# Patient Record
Sex: Female | Born: 1960
Health system: Southern US, Community
[De-identification: ages and names within clinical notes are randomized; demographics above are authoritative.]

## PROBLEM LIST (undated history)

## (undated) DIAGNOSIS — Z87898 Personal history of other specified conditions: Secondary | ICD-10-CM

## (undated) DIAGNOSIS — T7840XA Allergy, unspecified, initial encounter: Secondary | ICD-10-CM

## (undated) DIAGNOSIS — K602 Anal fissure, unspecified: Secondary | ICD-10-CM

## (undated) DIAGNOSIS — Z9289 Personal history of other medical treatment: Secondary | ICD-10-CM

## (undated) DIAGNOSIS — K5900 Constipation, unspecified: Secondary | ICD-10-CM

## (undated) DIAGNOSIS — M1990 Unspecified osteoarthritis, unspecified site: Secondary | ICD-10-CM

## (undated) DIAGNOSIS — M8580 Other specified disorders of bone density and structure, unspecified site: Secondary | ICD-10-CM

## (undated) DIAGNOSIS — I7300 Raynaud's syndrome without gangrene: Secondary | ICD-10-CM

## (undated) DIAGNOSIS — R55 Syncope and collapse: Secondary | ICD-10-CM

## (undated) DIAGNOSIS — A0472 Enterocolitis due to Clostridium difficile, not specified as recurrent: Secondary | ICD-10-CM

## (undated) DIAGNOSIS — I10 Essential (primary) hypertension: Secondary | ICD-10-CM

## (undated) DIAGNOSIS — I340 Nonrheumatic mitral (valve) insufficiency: Secondary | ICD-10-CM

## (undated) DIAGNOSIS — I951 Orthostatic hypotension: Secondary | ICD-10-CM

## (undated) DIAGNOSIS — Z780 Asymptomatic menopausal state: Secondary | ICD-10-CM

## (undated) DIAGNOSIS — Z803 Family history of malignant neoplasm of breast: Secondary | ICD-10-CM

## (undated) DIAGNOSIS — I341 Nonrheumatic mitral (valve) prolapse: Secondary | ICD-10-CM

## (undated) DIAGNOSIS — R51 Headache: Secondary | ICD-10-CM

## (undated) DIAGNOSIS — M7500 Adhesive capsulitis of unspecified shoulder: Secondary | ICD-10-CM

## (undated) DIAGNOSIS — E785 Hyperlipidemia, unspecified: Secondary | ICD-10-CM

## (undated) DIAGNOSIS — K219 Gastro-esophageal reflux disease without esophagitis: Secondary | ICD-10-CM

## (undated) DIAGNOSIS — D6862 Lupus anticoagulant syndrome: Secondary | ICD-10-CM

## (undated) DIAGNOSIS — K5790 Diverticulosis of intestine, part unspecified, without perforation or abscess without bleeding: Secondary | ICD-10-CM

## (undated) HISTORY — DX: Essential (primary) hypertension: I10

## (undated) HISTORY — DX: Other specified disorders of bone density and structure, unspecified site: M85.80

## (undated) HISTORY — DX: Hyperlipidemia, unspecified: E78.5

## (undated) HISTORY — DX: Constipation, unspecified: K59.00

## (undated) HISTORY — DX: Lupus anticoagulant syndrome: D68.62

## (undated) HISTORY — DX: Asymptomatic menopausal state: Z78.0

## (undated) HISTORY — DX: Enterocolitis due to Clostridium difficile, not specified as recurrent: A04.72

## (undated) HISTORY — DX: Unspecified osteoarthritis, unspecified site: M19.90

## (undated) HISTORY — DX: Diverticulosis of intestine, part unspecified, without perforation or abscess without bleeding: K57.90

## (undated) HISTORY — DX: Anal fissure, unspecified: K60.2

## (undated) HISTORY — DX: Personal history of other specified conditions: Z87.898

## (undated) HISTORY — DX: Raynaud's syndrome without gangrene: I73.00

## (undated) HISTORY — DX: Adhesive capsulitis of unspecified shoulder: M75.00

## (undated) HISTORY — PX: COLONOSCOPY: SHX174

## (undated) HISTORY — DX: Gastro-esophageal reflux disease without esophagitis: K21.9

## (undated) HISTORY — DX: Personal history of other medical treatment: Z92.89

## (undated) HISTORY — DX: Headache: R51

## (undated) HISTORY — DX: Nonrheumatic mitral (valve) prolapse: I34.1

## (undated) HISTORY — DX: Family history of malignant neoplasm of breast: Z80.3

## (undated) HISTORY — DX: Allergy, unspecified, initial encounter: T78.40XA

## (undated) HISTORY — DX: Nonrheumatic mitral (valve) insufficiency: I34.0

---

## 1979-08-30 HISTORY — PX: WISDOM TOOTH EXTRACTION: SHX21

## 1998-02-16 ENCOUNTER — Ambulatory Visit (HOSPITAL_COMMUNITY): Admission: RE | Admit: 1998-02-16 | Discharge: 1998-02-16 | Payer: Self-pay | Admitting: *Deleted

## 1998-02-17 ENCOUNTER — Inpatient Hospital Stay (HOSPITAL_COMMUNITY): Admission: AD | Admit: 1998-02-17 | Discharge: 1998-02-19 | Payer: Self-pay | Admitting: Obstetrics and Gynecology

## 1998-03-12 ENCOUNTER — Encounter (HOSPITAL_COMMUNITY): Admission: RE | Admit: 1998-03-12 | Discharge: 1998-06-10 | Payer: Self-pay | Admitting: *Deleted

## 1998-03-19 ENCOUNTER — Other Ambulatory Visit: Admission: RE | Admit: 1998-03-19 | Discharge: 1998-03-19 | Payer: Self-pay | Admitting: *Deleted

## 1998-06-18 ENCOUNTER — Encounter (HOSPITAL_COMMUNITY): Admission: RE | Admit: 1998-06-18 | Discharge: 1998-09-16 | Payer: Self-pay | Admitting: *Deleted

## 1999-07-29 ENCOUNTER — Other Ambulatory Visit: Admission: RE | Admit: 1999-07-29 | Discharge: 1999-07-29 | Payer: Self-pay | Admitting: *Deleted

## 2000-07-17 ENCOUNTER — Other Ambulatory Visit: Admission: RE | Admit: 2000-07-17 | Discharge: 2000-07-17 | Payer: Self-pay | Admitting: *Deleted

## 2002-01-04 ENCOUNTER — Other Ambulatory Visit: Admission: RE | Admit: 2002-01-04 | Discharge: 2002-01-04 | Payer: Self-pay | Admitting: *Deleted

## 2002-04-27 ENCOUNTER — Encounter: Payer: Self-pay | Admitting: Family Medicine

## 2002-04-27 ENCOUNTER — Ambulatory Visit (HOSPITAL_COMMUNITY): Admission: RE | Admit: 2002-04-27 | Discharge: 2002-04-27 | Payer: Self-pay | Admitting: Family Medicine

## 2003-08-27 ENCOUNTER — Emergency Department (HOSPITAL_COMMUNITY): Admission: EM | Admit: 2003-08-27 | Discharge: 2003-08-27 | Payer: Self-pay | Admitting: Emergency Medicine

## 2004-01-30 ENCOUNTER — Other Ambulatory Visit: Admission: RE | Admit: 2004-01-30 | Discharge: 2004-01-30 | Payer: Self-pay | Admitting: Obstetrics and Gynecology

## 2004-07-12 ENCOUNTER — Ambulatory Visit: Payer: Self-pay | Admitting: Internal Medicine

## 2004-07-28 ENCOUNTER — Ambulatory Visit: Payer: Self-pay | Admitting: Gastroenterology

## 2004-08-09 ENCOUNTER — Ambulatory Visit: Payer: Self-pay | Admitting: Internal Medicine

## 2004-08-11 ENCOUNTER — Ambulatory Visit: Payer: Self-pay | Admitting: Gastroenterology

## 2004-08-29 HISTORY — PX: KNEE SURGERY: SHX244

## 2004-10-18 ENCOUNTER — Ambulatory Visit: Payer: Self-pay | Admitting: Internal Medicine

## 2005-07-05 ENCOUNTER — Ambulatory Visit (HOSPITAL_BASED_OUTPATIENT_CLINIC_OR_DEPARTMENT_OTHER): Admission: RE | Admit: 2005-07-05 | Discharge: 2005-07-05 | Payer: Self-pay | Admitting: Orthopaedic Surgery

## 2005-07-05 ENCOUNTER — Ambulatory Visit (HOSPITAL_COMMUNITY): Admission: RE | Admit: 2005-07-05 | Discharge: 2005-07-05 | Payer: Self-pay | Admitting: Orthopaedic Surgery

## 2005-08-29 HISTORY — PX: FOOT SURGERY: SHX648

## 2005-10-11 ENCOUNTER — Other Ambulatory Visit: Admission: RE | Admit: 2005-10-11 | Discharge: 2005-10-11 | Payer: Self-pay | Admitting: Obstetrics and Gynecology

## 2005-10-14 ENCOUNTER — Ambulatory Visit: Payer: Self-pay | Admitting: Internal Medicine

## 2005-12-06 ENCOUNTER — Ambulatory Visit (HOSPITAL_BASED_OUTPATIENT_CLINIC_OR_DEPARTMENT_OTHER): Admission: RE | Admit: 2005-12-06 | Discharge: 2005-12-06 | Payer: Self-pay | Admitting: Orthopaedic Surgery

## 2005-12-06 ENCOUNTER — Encounter (INDEPENDENT_AMBULATORY_CARE_PROVIDER_SITE_OTHER): Payer: Self-pay | Admitting: Specialist

## 2006-01-02 ENCOUNTER — Ambulatory Visit: Payer: Self-pay | Admitting: Gastroenterology

## 2006-04-28 ENCOUNTER — Ambulatory Visit: Payer: Self-pay | Admitting: Internal Medicine

## 2006-06-21 ENCOUNTER — Ambulatory Visit: Payer: Self-pay | Admitting: Internal Medicine

## 2007-01-24 ENCOUNTER — Ambulatory Visit: Payer: Self-pay | Admitting: Internal Medicine

## 2007-01-29 ENCOUNTER — Ambulatory Visit: Payer: Self-pay | Admitting: Internal Medicine

## 2007-01-29 LAB — CONVERTED CEMR LAB
ALT: 16 units/L (ref 0–40)
AST: 23 units/L (ref 0–37)
Albumin: 3.8 g/dL (ref 3.5–5.2)
Alkaline Phosphatase: 31 units/L — ABNORMAL LOW (ref 39–117)
Anti Nuclear Antibody(ANA): NEGATIVE
BUN: 5 mg/dL — ABNORMAL LOW (ref 6–23)
Basophils Absolute: 0 10*3/uL (ref 0.0–0.1)
Basophils Relative: 0.7 % (ref 0.0–1.0)
Bilirubin Urine: NEGATIVE
Bilirubin, Direct: 0.1 mg/dL (ref 0.0–0.3)
CO2: 29 meq/L (ref 19–32)
Calcium: 9.2 mg/dL (ref 8.4–10.5)
Chloride: 107 meq/L (ref 96–112)
Creatinine, Ser: 0.9 mg/dL (ref 0.4–1.2)
Crystals: NEGATIVE
Eosinophils Absolute: 0.2 10*3/uL (ref 0.0–0.6)
Eosinophils Relative: 4.7 % (ref 0.0–5.0)
GFR calc Af Amer: 87 mL/min
GFR calc non Af Amer: 72 mL/min
Glucose, Bld: 106 mg/dL — ABNORMAL HIGH (ref 70–99)
HCT: 37.1 % (ref 36.0–46.0)
Hemoglobin: 12.7 g/dL (ref 12.0–15.0)
Ketones, ur: NEGATIVE mg/dL
Leukocytes, UA: NEGATIVE
Lymphocytes Relative: 37 % (ref 12.0–46.0)
MCHC: 34.2 g/dL (ref 30.0–36.0)
MCV: 98.8 fL (ref 78.0–100.0)
Monocytes Absolute: 0.4 10*3/uL (ref 0.2–0.7)
Monocytes Relative: 9.9 % (ref 3.0–11.0)
Mucus, UA: NEGATIVE
Neutro Abs: 2 10*3/uL (ref 1.4–7.7)
Neutrophils Relative %: 47.7 % (ref 43.0–77.0)
Nitrite: NEGATIVE
Platelets: 213 10*3/uL (ref 150–400)
Potassium: 4.5 meq/L (ref 3.5–5.1)
RBC / HPF: NONE SEEN
RBC: 3.75 M/uL — ABNORMAL LOW (ref 3.87–5.11)
RDW: 11.7 % (ref 11.5–14.6)
Rheumatoid fact SerPl-aCnc: <20 intl units/mL — ABNORMAL LOW (ref 0.0–20.0)
Sed Rate: 5 mm/hr (ref 0–25)
Sodium: 141 meq/L (ref 135–145)
Specific Gravity, Urine: 1.01 (ref 1.000–1.03)
TSH: 2.65 microintl units/mL (ref 0.35–5.50)
Total Bilirubin: 0.6 mg/dL (ref 0.3–1.2)
Total Protein, Urine: NEGATIVE mg/dL
Total Protein: 6.4 g/dL (ref 6.0–8.3)
Urine Glucose: NEGATIVE mg/dL
Urobilinogen, UA: 0.2 (ref 0.0–1.0)
Vit D, 1,25-Dihydroxy: 29 (ref 20–57)
Vitamin B-12: 228 pg/mL (ref 211–911)
WBC: 4.2 10*3/uL — ABNORMAL LOW (ref 4.5–10.5)
pH: 7.5 (ref 5.0–8.0)

## 2007-05-18 ENCOUNTER — Encounter: Payer: Self-pay | Admitting: *Deleted

## 2007-05-18 DIAGNOSIS — M949 Disorder of cartilage, unspecified: Secondary | ICD-10-CM

## 2007-05-18 DIAGNOSIS — K219 Gastro-esophageal reflux disease without esophagitis: Secondary | ICD-10-CM | POA: Insufficient documentation

## 2007-05-18 DIAGNOSIS — I10 Essential (primary) hypertension: Secondary | ICD-10-CM | POA: Insufficient documentation

## 2007-05-18 DIAGNOSIS — M899 Disorder of bone, unspecified: Secondary | ICD-10-CM | POA: Insufficient documentation

## 2007-11-01 ENCOUNTER — Ambulatory Visit: Payer: Self-pay | Admitting: Internal Medicine

## 2007-11-02 ENCOUNTER — Encounter: Payer: Self-pay | Admitting: Internal Medicine

## 2007-11-07 ENCOUNTER — Ambulatory Visit: Payer: Self-pay | Admitting: Internal Medicine

## 2007-11-07 DIAGNOSIS — J329 Chronic sinusitis, unspecified: Secondary | ICD-10-CM | POA: Insufficient documentation

## 2007-11-07 DIAGNOSIS — R42 Dizziness and giddiness: Secondary | ICD-10-CM | POA: Insufficient documentation

## 2007-11-07 DIAGNOSIS — R079 Chest pain, unspecified: Secondary | ICD-10-CM | POA: Insufficient documentation

## 2007-11-08 LAB — CONVERTED CEMR LAB
ALT: 14 units/L (ref 0–35)
AST: 18 units/L (ref 0–37)
Albumin: 3.8 g/dL (ref 3.5–5.2)
Alkaline Phosphatase: 38 units/L — ABNORMAL LOW (ref 39–117)
BUN: 7 mg/dL (ref 6–23)
Basophils Absolute: 0 10*3/uL (ref 0.0–0.1)
Basophils Relative: 0.6 % (ref 0.0–1.0)
Bilirubin Urine: NEGATIVE
Bilirubin, Direct: 0.1 mg/dL (ref 0.0–0.3)
CO2: 30 meq/L (ref 19–32)
Calcium: 9.3 mg/dL (ref 8.4–10.5)
Chloride: 105 meq/L (ref 96–112)
Cholesterol: 196 mg/dL (ref 0–200)
Creatinine, Ser: 0.8 mg/dL (ref 0.4–1.2)
Crystals: NEGATIVE
Eosinophils Absolute: 0.3 10*3/uL (ref 0.0–0.6)
Eosinophils Relative: 5.3 % — ABNORMAL HIGH (ref 0.0–5.0)
GFR calc Af Amer: 99 mL/min
GFR calc non Af Amer: 82 mL/min
Glucose, Bld: 107 mg/dL — ABNORMAL HIGH (ref 70–99)
HCT: 39.7 % (ref 36.0–46.0)
HDL: 88 mg/dL (ref >39.0)
Hemoglobin: 13 g/dL (ref 12.0–15.0)
Ketones, ur: NEGATIVE mg/dL
LDL Cholesterol: 97 mg/dL (ref 0–99)
Lymphocytes Relative: 34.1 % (ref 12.0–46.0)
MCHC: 32.7 g/dL (ref 30.0–36.0)
MCV: 100.5 fL — ABNORMAL HIGH (ref 78.0–100.0)
Monocytes Absolute: 0.6 10*3/uL (ref 0.2–0.7)
Monocytes Relative: 13 % — ABNORMAL HIGH (ref 3.0–11.0)
Neutro Abs: 2.3 10*3/uL (ref 1.4–7.7)
Neutrophils Relative %: 47 % (ref 43.0–77.0)
Nitrite: NEGATIVE
Platelets: 240 10*3/uL (ref 150–400)
Potassium: 4.3 meq/L (ref 3.5–5.1)
RBC: 3.95 M/uL (ref 3.87–5.11)
RDW: 11.8 % (ref 11.5–14.6)
Sodium: 141 meq/L (ref 135–145)
Specific Gravity, Urine: 1.01 (ref 1.000–1.03)
TSH: 1.79 microintl units/mL (ref 0.35–5.50)
Total Bilirubin: 0.5 mg/dL (ref 0.3–1.2)
Total CHOL/HDL Ratio: 2.2
Total Protein, Urine: NEGATIVE mg/dL
Total Protein: 6.5 g/dL (ref 6.0–8.3)
Triglycerides: 56 mg/dL (ref 0–149)
Urine Glucose: NEGATIVE mg/dL
Urobilinogen, UA: 0.2 (ref 0.0–1.0)
VLDL: 11 mg/dL (ref 0–40)
WBC: 4.9 10*3/uL (ref 4.5–10.5)
pH: 7 (ref 5.0–8.0)

## 2007-12-10 LAB — CONVERTED CEMR LAB: Pap Smear: ABNORMAL

## 2008-01-04 ENCOUNTER — Ambulatory Visit: Payer: Self-pay | Admitting: Internal Medicine

## 2008-08-25 ENCOUNTER — Ambulatory Visit: Payer: Self-pay | Admitting: Gastroenterology

## 2008-09-08 ENCOUNTER — Ambulatory Visit: Payer: Self-pay | Admitting: Gastroenterology

## 2008-09-08 LAB — HM COLONOSCOPY

## 2008-11-18 ENCOUNTER — Ambulatory Visit: Payer: Self-pay | Admitting: Internal Medicine

## 2008-11-18 ENCOUNTER — Telehealth: Payer: Self-pay | Admitting: Internal Medicine

## 2008-11-18 LAB — CONVERTED CEMR LAB: Sed Rate: 5 mm/hr (ref 0–22)

## 2008-11-19 ENCOUNTER — Encounter: Payer: Self-pay | Admitting: Internal Medicine

## 2008-11-19 LAB — CONVERTED CEMR LAB
Anti Nuclear Antibody(ANA): NEGATIVE
Anticardiolipin IgA: 11 (ref <13)
Anticardiolipin IgG: <7 (ref <11)
Anticardiolipin IgM: <7 (ref <10)
Vit D, 1,25-Dihydroxy: 48 (ref 30–89)

## 2008-11-21 ENCOUNTER — Telehealth: Payer: Self-pay | Admitting: Internal Medicine

## 2008-11-24 ENCOUNTER — Ambulatory Visit: Payer: Self-pay | Admitting: Hematology & Oncology

## 2008-11-26 ENCOUNTER — Encounter: Payer: Self-pay | Admitting: Internal Medicine

## 2008-11-26 LAB — CBC WITH DIFFERENTIAL (CANCER CENTER ONLY)
BASO#: 0 10*3/uL (ref 0.0–0.2)
BASO%: 0.9 % (ref 0.0–2.0)
EOS%: 3.8 % (ref 0.0–7.0)
Eosinophils Absolute: 0.2 10*3/uL (ref 0.0–0.5)
HCT: 42 % (ref 34.8–46.6)
HGB: 13.9 g/dL (ref 11.6–15.9)
LYMPH#: 1.3 10*3/uL (ref 0.9–3.3)
LYMPH%: 26 % (ref 14.0–48.0)
MCH: 32.4 pg (ref 26.0–34.0)
MCHC: 33 g/dL (ref 32.0–36.0)
MCV: 98 fL (ref 81–101)
MONO#: 0.3 10*3/uL (ref 0.1–0.9)
MONO%: 6.9 % (ref 0.0–13.0)
NEUT#: 3 10*3/uL (ref 1.5–6.5)
NEUT%: 62.4 % (ref 39.6–80.0)
Platelets: 202 10*3/uL (ref 145–400)
RBC: 4.28 10*6/uL (ref 3.70–5.32)
RDW: 10 % — ABNORMAL LOW (ref 10.5–14.6)
WBC: 4.8 10*3/uL (ref 3.9–10.0)

## 2008-11-26 LAB — PROTIME-INR (CHCC SATELLITE)
INR: 1 — ABNORMAL LOW (ref 2.0–3.5)
Protime: 12 Seconds (ref 10.6–13.4)

## 2008-11-26 LAB — CHCC SATELLITE - SMEAR

## 2008-12-01 ENCOUNTER — Ambulatory Visit: Payer: Self-pay | Admitting: Hematology

## 2008-12-01 LAB — PROTIME-INR
INR: 1.5 — ABNORMAL LOW (ref 2.00–3.50)
Protime: 18 Seconds — ABNORMAL HIGH (ref 10.6–13.4)

## 2008-12-03 ENCOUNTER — Ambulatory Visit: Payer: Self-pay | Admitting: Vascular Surgery

## 2008-12-08 LAB — PROTIME-INR (CHCC SATELLITE)
INR: 2.3 (ref 2.0–3.5)
Protime: 27.6 Seconds — ABNORMAL HIGH (ref 10.6–13.4)

## 2008-12-15 LAB — PROTIME-INR
INR: 3 (ref 2.00–3.50)
Protime: 36 Seconds — ABNORMAL HIGH (ref 10.6–13.4)

## 2008-12-22 ENCOUNTER — Encounter: Payer: Self-pay | Admitting: Gastroenterology

## 2008-12-22 ENCOUNTER — Encounter: Payer: Self-pay | Admitting: Internal Medicine

## 2008-12-22 LAB — CBC WITH DIFFERENTIAL (CANCER CENTER ONLY)
BASO#: 0 10*3/uL (ref 0.0–0.2)
BASO%: 0.8 % (ref 0.0–2.0)
EOS%: 3.3 % (ref 0.0–7.0)
Eosinophils Absolute: 0.2 10*3/uL (ref 0.0–0.5)
HCT: 38.3 % (ref 34.8–46.6)
HGB: 13.1 g/dL (ref 11.6–15.9)
LYMPH#: 1.9 10*3/uL (ref 0.9–3.3)
LYMPH%: 34.9 % (ref 14.0–48.0)
MCH: 33 pg (ref 26.0–34.0)
MCHC: 34.2 g/dL (ref 32.0–36.0)
MCV: 97 fL (ref 81–101)
MONO#: 0.3 10*3/uL (ref 0.1–0.9)
MONO%: 5.9 % (ref 0.0–13.0)
NEUT#: 3 10*3/uL (ref 1.5–6.5)
NEUT%: 55.1 % (ref 39.6–80.0)
Platelets: 191 10*3/uL (ref 145–400)
RBC: 3.97 10*6/uL (ref 3.70–5.32)
RDW: 10.3 % — ABNORMAL LOW (ref 10.5–14.6)
WBC: 5.5 10*3/uL (ref 3.9–10.0)

## 2008-12-22 LAB — PROTIME-INR (CHCC SATELLITE)
INR: 1.4 — ABNORMAL LOW (ref 2.0–3.5)
Protime: 16.8 Seconds — ABNORMAL HIGH (ref 10.6–13.4)

## 2008-12-25 ENCOUNTER — Ambulatory Visit: Payer: Self-pay | Admitting: Internal Medicine

## 2008-12-25 DIAGNOSIS — M7500 Adhesive capsulitis of unspecified shoulder: Secondary | ICD-10-CM | POA: Insufficient documentation

## 2009-01-12 ENCOUNTER — Ambulatory Visit: Payer: Self-pay | Admitting: Hematology

## 2009-01-12 LAB — PROTIME-INR
INR: 1.4 — ABNORMAL LOW (ref 2.00–3.50)
Protime: 16.8 Seconds — ABNORMAL HIGH (ref 10.6–13.4)

## 2009-01-19 LAB — PROTIME-INR
INR: 2.5 (ref 2.00–3.50)
Protime: 30 Seconds — ABNORMAL HIGH (ref 10.6–13.4)

## 2009-01-30 ENCOUNTER — Ambulatory Visit: Payer: Self-pay | Admitting: Hematology & Oncology

## 2009-02-02 ENCOUNTER — Encounter: Payer: Self-pay | Admitting: Internal Medicine

## 2009-02-02 LAB — PROTIME-INR (CHCC SATELLITE)
INR: 2.4 (ref 2.0–3.5)
Protime: 28.8 Seconds — ABNORMAL HIGH (ref 10.6–13.4)

## 2009-02-02 LAB — CBC WITH DIFFERENTIAL (CANCER CENTER ONLY)
BASO#: 0 10*3/uL (ref 0.0–0.2)
BASO%: 0.4 % (ref 0.0–2.0)
EOS%: 3.5 % (ref 0.0–7.0)
Eosinophils Absolute: 0.1 10*3/uL (ref 0.0–0.5)
HCT: 34.8 % (ref 34.8–46.6)
HGB: 11.6 g/dL (ref 11.6–15.9)
LYMPH#: 1.3 10*3/uL (ref 0.9–3.3)
LYMPH%: 32 % (ref 14.0–48.0)
MCH: 32.2 pg (ref 26.0–34.0)
MCHC: 33.3 g/dL (ref 32.0–36.0)
MCV: 97 fL (ref 81–101)
MONO#: 0.3 10*3/uL (ref 0.1–0.9)
MONO%: 6.4 % (ref 0.0–13.0)
NEUT#: 2.3 10*3/uL (ref 1.5–6.5)
NEUT%: 57.7 % (ref 39.6–80.0)
Platelets: 200 10*3/uL (ref 145–400)
RBC: 3.6 10*6/uL — ABNORMAL LOW (ref 3.70–5.32)
RDW: 11.2 % (ref 10.5–14.6)
WBC: 4 10*3/uL (ref 3.9–10.0)

## 2009-02-03 ENCOUNTER — Encounter: Payer: Self-pay | Admitting: Internal Medicine

## 2009-02-03 LAB — LUPUS ANTICOAGULANT PANEL
DRVVT 1:1 Mix: 38.7 secs (ref 36.1–47.0)
DRVVT: 55 secs — ABNORMAL HIGH (ref 36.1–47.0)
PTT Lupus Anticoagulant: 55.6 secs — ABNORMAL HIGH (ref 36.3–48.8)
PTTLA 4:1 Mix: 46.4 secs (ref 36.3–48.8)

## 2009-02-17 ENCOUNTER — Ambulatory Visit (HOSPITAL_COMMUNITY): Admission: RE | Admit: 2009-02-17 | Discharge: 2009-02-17 | Payer: Self-pay | Admitting: Cardiovascular Disease

## 2009-02-23 LAB — PROTIME-INR
INR: 1 — ABNORMAL LOW (ref 2.00–3.50)
Protime: 12 Seconds (ref 10.6–13.4)

## 2009-03-11 ENCOUNTER — Encounter: Payer: Self-pay | Admitting: Internal Medicine

## 2009-03-19 ENCOUNTER — Ambulatory Visit: Payer: Self-pay | Admitting: Hematology

## 2009-04-16 ENCOUNTER — Ambulatory Visit: Payer: Self-pay | Admitting: Hematology

## 2010-02-22 ENCOUNTER — Telehealth: Payer: Self-pay | Admitting: Internal Medicine

## 2010-05-11 LAB — HM MAMMOGRAPHY: HM Mammogram: NORMAL

## 2010-05-11 LAB — CONVERTED CEMR LAB: Pap Smear: NORMAL

## 2010-07-01 ENCOUNTER — Ambulatory Visit: Payer: Self-pay | Admitting: Family

## 2010-07-01 DIAGNOSIS — J029 Acute pharyngitis, unspecified: Secondary | ICD-10-CM | POA: Insufficient documentation

## 2010-09-19 ENCOUNTER — Encounter: Payer: Self-pay | Admitting: Internal Medicine

## 2010-09-30 NOTE — Letter (Signed)
[facility]   Imported By: Lanelle Bal 03/30/2009 15:09:30  _____________________________________________________________________  External Attachment:    Type:   Image     Comment:   External Document

## 2010-09-30 NOTE — Progress Notes (Signed)
Summary: Nexium Refill  Phone Note Refill Request Message from:  Fax from Pharmacy on February 22, 2010 9:09 AM  Refills Requested: Medication #1:  NEXIUM 40 MG  CPDR one by mouth  two times a day   Dosage confirmed as above?Dosage Confirmed   Brand Name Necessary? No   Supply Requested: 1 month   Last Refilled: 11/17/2009  Method Requested: Electronic Next Appointment Scheduled: None Initial call taken by: Glendell Docker CMA,  February 22, 2010 9:10 AM  Follow-up for Phone Call        refill x 3.  Please inform pt , it has been over 1 yr since last OV.  I can not provide refills unless pt seen within 1 yr.  I suggest  CPX visit when convenient for pt Follow-up by: D. Thomos Lemons DO,  February 22, 2010 5:36 PM  Additional Follow-up for Phone Call Additional follow up Details #1::        patient advised per Dr Artist Pais instructions, she has schdeduled for 8/9 @ 11 am,  fastingblood work entered for [facility] for 8/2  for CBCD,BMET, LIPID AND TSH.  refill submitted via escript Additional Follow-up by: Glendell Docker CMA,  February 22, 2010 5:44 PM

## 2010-09-30 NOTE — Letter (Signed)
Summary: Office Note / [facility].  Office Note / [facility].   Imported By: Lennie Odor 04/30/2009 16:10:35  _____________________________________________________________________  External Attachment:    Type:   Image     Comment:   External Document

## 2010-09-30 NOTE — Miscellaneous (Signed)
Summary: Orders Update  Clinical Lists Changes  Orders: Added new Test order of TLB-Udip w/ Micro (81001-URINE) - Signed 

## 2010-09-30 NOTE — Progress Notes (Signed)
  Phone Note Outgoing Call   Summary of Call: discussed blood test results with patient.   sed rate and ana is negative.  She recently started nifedipine.  Before she started medication - right hand turned blood.  She has not had any further episodes since starting medication.  I advised pt follow up with rheumatologist at [facility] for further evaluation Initial call taken by: D. Thomos Lemons DO,  November 21, 2008 1:06 PM  Follow-up for Phone Call        Called pt re:  abnormal result on antiphospholipid panel.   I advised pt start anticoagulation.  I discussed clinical case with Dr. Myna Hidalgo.   Start pt on Arixtra 7.5 mg daily until coumadin can be started.    We can cancel referral to [facility].   Arrange follow up with Dr. Myna Hidalgo  - Sheral Flow or Tues  Stop Aspirin.  Stop nifedipine Follow-up by: D. Thomos Lemons DO,  November 21, 2008 5:40 PM  Additional Follow-up for Phone Call Additional follow up Details #1::        Appt  Dr Myna Hidalgo  Wednesday March 31 @  7:30am   Pt notified   Additional Follow-up by: Darral Dash,  November 24, 2008 2:33 PM  New Problems: PRIMARY HYPERCOAGULABLE STATE (ICD-289.81)   New Problems: PRIMARY HYPERCOAGULABLE STATE (ICD-289.81) New/Updated Medications: ARIXTRA 7.5 MG/0.6ML SOLN (FONDAPARINUX SODIUM) 7.5 mg [place] once daily

## 2010-09-30 NOTE — Assessment & Plan Note (Signed)
Summary: FU-PER PT RS-$50-STC   Vital Signs:  Patient Profile:   50 Years Old Female Height:     66 inches Weight:      134.38 pounds BMI:     21.77 Temp:     99.3 degrees F oral Pulse rate:   86 / minute BP sitting:   128 / 79  (right arm)  Vitals Entered By: Glendell Docker (Jan 04, 2008 10:31 AM)                 Chief Complaint:  6 WEEK F/U.  History of Present Illness: 50 y/o white female for follow up.  Pt reports sinus infection resolved with ceftin.  Her sleep issues are much better with lunesta.  She used for 1-2 wks.  No adverse effects noted.  Her daughter is still struggling with Crohn's disease.    Current Allergies (reviewed today): No known allergies   Past Medical History:    Reviewed history from 05/18/2007 and no changes required:       GERD       Hypertension       Osteopenia   Family History:    Father has anemia.    Mother has history of colon cancer.   Risk Factors:  PAP Smear History:     Date of Last PAP Smear:  12/10/2007    Results:  abnormal-Repeat in 6 months     Physical Exam  General:     alert, well-developed, and well-nourished.   Lungs:     normal respiratory effort and no accessory muscle use.   Heart:     normal rate, regular rhythm, and no gallop.   Psych:     normally interactive, good eye contact, not anxious appearing, and not depressed appearing.      Impression & Recommendations:  Problem # 1:  INSOMNIA (ICD-780.52) Assessment: Improved Transient insomnia due to life stressors.  Resolved.  Pt can use lunesta on as needed basis. Her updated medication list for this problem includes:    Lunesta 2 Mg Tabs (Eszopiclone) ..... One by mouth at bedtime prn   Problem # 2:  MITRAL VALVE PROLAPSE (ICD-424.0) Assessment: Improved Pt previously complained of dizzy spells .  Pt reports cardiology evaluation.  Neg w/u.  Symptoms resolved. Her updated medication list for this problem includes:    Adult Aspirin Low  Strength 81 Mg Tbdp (Aspirin) ..... Once daily   Complete Medication List: 1)  Adult Aspirin Low Strength 81 Mg Tbdp (Aspirin) .... Once daily 2)  Nexium 40 Mg Cpdr (Esomeprazole magnesium) .... One by mouth  two times a day 3)  Lunesta 2 Mg Tabs (Eszopiclone) .... One by mouth at bedtime prn    ] Current Allergies (reviewed today): No known allergies    Preventive Care Screening  Pap Smear:    Date:  12/10/2007    Results:  abnormal-Repeat in 6 months

## 2010-09-30 NOTE — Miscellaneous (Signed)
Summary: [facility] Previsit/prep  Clinical Lists Changes  Medications: Added new medication of MOVIPREP 100 GM  SOLR (PEG-KCL-NACL-NASULF-NA ASC-C) As per prep instructions. - Signed Rx of MOVIPREP 100 GM  SOLR (PEG-KCL-NACL-NASULF-NA ASC-C) As per prep instructions.;  #1 x 0;  Signed;  Entered by: Wyona Almas RN;  Authorized by: Meryl Dare MD Clementeen Graham;  Method used: Electronically to [facility]*, [address], [place], [place]  [id number], Ph: [phone], Fax: [phone] Observations: Added new observation of NKA: T (08/25/2008 9:01)    Prescriptions: MOVIPREP 100 GM  SOLR (PEG-KCL-NACL-NASULF-NA ASC-C) As per prep instructions.  #1 x 0   Entered by:   Wyona Almas RN   Authorized by:   Meryl Dare MD [facility]   Signed by:   Wyona Almas RN on 08/25/2008   Method used:   Electronically to        [facility]* (retail)       [address]       [place], [place]  [id number]       Ph: [phone]       Fax: [phone]   RxID:   [id number]   Appended Document: [facility] Previsit/prep Pt. has hx of reflus and has been on Nexium "for years".  Pt. asked how long one had to stay on Nexium.  On questioning her, she said her Maternal Grandfather had esophageal cancer.  I offered her to have an appt. with Dr. Russella Dar to set up an endoscopy for diagnostic purposes. She refused for now.  Said maybe  at a later time.  Pamphlets on Reflux and Barrett's given to pt.  Advised pt. to mention long term use of Nexium to Dr. Russella Dar.  Appended Document: [facility] Previsit/prep On reviewing chart I see pt. had an endoscopy in 2005 that showed reflux. when pt was questioned, she said she'd not had an endoscopy before.

## 2010-09-30 NOTE — Progress Notes (Signed)
  Phone Note Call from Patient   Caller: Patient Summary of Call: Pt. made a appointment for tomorrow since Dr. Artist Pais had no available time today. She states her finger has been blue for 2days and is worried about it.  She does have Raynaud's Syndrome but it has never stayed blue! Just wanted to make sure that her waiting to see Dr. Artist Pais until  Wednesday will be fine and she does not need to be worked in today?? Call pt. back and let her know something. Initial call taken by: Michaelle Copas,  November 18, 2008 2:06 PM  Follow-up for Phone Call        have her come in 4:30 pm Follow-up by: D. Thomos Lemons DO,  November 18, 2008 2:15 PM  Additional Follow-up for Phone Call Additional follow up Details #1::        Called pt. and she will come in today at 4:30 pm Additional Follow-up by: Michaelle Copas,  November 18, 2008 2:18 PM

## 2010-09-30 NOTE — Letter (Signed)
Summary: Stacey Drain, MD Rheumatology  Stacey Drain, MD Rheumatology   Imported By: Lanelle Bal 02/11/2009 11:53:31  _____________________________________________________________________  External Attachment:    Type:   Image     Comment:   External Document

## 2010-09-30 NOTE — Assessment & Plan Note (Signed)
Summary: sore throat/mhf   Vital Signs:  Patient profile:   50 year old female Height:      66 inches Weight:      135.25 pounds BMI:     21.91 O2 Sat:      100 % on Room air Temp:     97.8 degrees F oral Pulse rate:   64 / minute Pulse rhythm:   regular Resp:     16 per minute BP sitting:   116 / 72  (right arm) Cuff size:   regular  Vitals Entered By: Glendell Docker CMA (July 01, 2010 9:09 AM)  O2 Flow:  Room air CC: Sore throat Is Patient Diabetic? No Pain Assessment Patient in pain? no      Comments c/o sudden onset of sore throat this am, states she has not been feeling well for the past few days   Visit Type:  acute Primary Care Provider:  Dondra Spry DO  CC:  Sore throat.  History of Present Illness: Ms Ferran is a a 50 year old female who presents today with 24 hour hx of sore throat. (R side of throat)  Also  reports + mucous in throat for past few days.  Mild ear pain on the right.  No OTC meds used.  Daughter had GI upset yesterday.  Notes that she has participated in multiple group meetings at work where she may have been exposed to sick contacts.  Preventive Screening-Counseling & Management  Alcohol-Tobacco     Smoking Status: quit  Allergies (verified): No Known Drug Allergies  Past History:  Past Medical History: Last updated: 12/25/2008 GERD Hypertension Osteopenia Raynauds Vitamin D deficiency Hx of abnormal PAP - S/P colposcopy Ischemic digit  secondary to antiphospholipid syndrome and lupus anticoagulant 10/2008 Chronic anticoagulation Hx of frozen shoulder - bilateral    Review of Systems       see HPI  Physical Exam  General:  Well-developed,well-nourished,in no acute distress; alert,appropriate and cooperative throughout examination Head:  Normocephalic and atraumatic without obvious abnormalities. No apparent alopecia or balding. Eyes:  PERRLA, sclera are anicteric Ears:  External ear exam shows no significant  lesions or deformities.  Otoscopic examination reveals clear canals, tympanic membranes are intact bilaterally without bulging, retraction, inflammation or discharge. Hearing is grossly normal bilaterally. Mouth:  Mild pharyngeal erythema without exudates Neck:  No deformities, masses, or tenderness noted. Lungs:  Normal respiratory effort, chest expands symmetrically. Lungs are clear to auscultation, no crackles or wheezes. Heart:  Normal rate and regular rhythm. S1 and S2 normal without gallop, murmur, click, rub or other extra sounds. Cervical Nodes:  No lymphadenopathy noted   Impression & Recommendations:  Problem # 1:  PHARYNGITIS, VIRAL, ACUTE (ICD-462) Assessment New Rapid strep is negative.  Recommended conservative measures-: Gargle twice daily with salt water. Take Tylenol 650mg  every 6 hours as needed for pain You may use over the counter Cepacol lozenges or Chloraseptic spray as needed for sore throat.  Complete Medication List: 1)  Nexium 40 Mg Cpdr (Esomeprazole magnesium) .... One by mouth  two times a day 2)  Temazepam 15 Mg Caps (Temazepam) .... One tablet by mouth at bedtime as needed 3)  Vitamin D 2000 Unit Tabs (Cholecalciferol) .... Take 1 tablet by mouth once a day 4)  B-100 Tabs (Vitamins-lipotropics) .... Take 1 tablet by mouth once a day  Other Orders: Admin 1st Vaccine (69629) Flu Vaccine 2yrs + (52841)  Patient Instructions: 1)  Gargle twice daily with salt  water. 2)  Take Tylenol 650mg  every 6 hours as needed for pain 3)  You may use over the counter Cepacol lozenges or Chloraseptic spray as needed for sore throat. 4)  Call if your symptoms worsen, or if they are not improved in the next 48-72 hours.   Orders Added: 1)  Est. Patient Level III [16109] 2)  Admin 1st Vaccine [90471] 3)  Flu Vaccine 62yrs + [60454]    Current Allergies (reviewed today): No known allergies    Preventive Care Screening  Mammogram:    Date:  05/11/2010     Results:  normal   Pap Smear:    Date:  05/11/2010    Results:  normal   Flu Vaccine Consent Questions     Do you have a history of severe allergic reactions to this vaccine? no    Any prior history of allergic reactions to egg and/or gelatin? no    Do you have a sensitivity to the preservative Thimersol? no    Do you have a past history of Guillan-Barre Syndrome? no    Do you currently have an acute febrile illness? no    Have you ever had a severe reaction to latex? no    Vaccine information given and explained to patient? yes    Are you currently pregnant? no    Lot Number:[id number]   Exp Date:02/26/2011   Site Given  Left Deltoid IM.  Nicki Guadalajara Fergerson CMA Duncan Dull)  July 01, 2010 10:13 AM

## 2010-09-30 NOTE — Assessment & Plan Note (Signed)
Summary: CPX/ NO PAP /$50 /NWS  Medications Added NEXIUM 40 MG  CPDR (ESOMEPRAZOLE MAGNESIUM) one by mouth  two times a day LUNESTA 2 MG  TABS (ESZOPICLONE) one by mouth at bedtime prn CEFTIN 500 MG  TABS (CEFUROXIME AXETIL) one by mouth two times a day        Vital Signs:  Patient Profile:   50 Years Old Female Height:     66 inches Weight:      132.50 pounds BMI:     21.46 Temp:     97.9 degrees F oral Pulse rate:   92 / minute BP sitting:   124 / 92  (right arm)  Vitals Entered By: Glendell Docker (November 07, 2007 9:00 AM)                 Chief Complaint:  Multiple medical problems or concerns.  History of Present Illness: 50 year old with multiple complaints.  She reports URI symptoms >1 wk.  She has had dizzy spells.  She feels like she is going to pass out.  Her symptoms last 15 secs.  She denies prodromal symptoms.  She has history of palpitations.    She also suffered rib injury while reaching across driver seat to close minivan door.    Current Allergies: No known allergies   Past Medical History:    Reviewed history from 05/18/2007 and no changes required:       GERD       Hypertension       Osteopenia   Social History:    Married    3 children    Occupation:  Technical sales engineer     Former Smoker     Alcohol use-yes - (1-2 glasses of wine per night)   Risk Factors:  Tobacco use:  quit Alcohol use:  yes  Mammogram History:     Date of Last Mammogram:  09/17/2007    Results:  normal     Physical Exam  General:     alert, well-developed, and well-nourished.   Head:     normocephalic and atraumatic.   Neck:     supple.   Chest Wall:     chest wall tenderness right chest (7th and 8th rib) Lungs:     normal respiratory effort and normal breath sounds.   Heart:     normal rate, regular rhythm, no murmur, no gallop, and no rub.   Abdomen:     soft, non-tender, no hepatomegaly, and no splenomegaly.   Extremities:     No lower extremity  edema  Neurologic:     cranial nerves II-XII intact.   Psych:     good eye contact and slightly anxious.      Impression & Recommendations:  Problem # 1:  DIZZINESS AND GIDDINESS (ICD-780.4) Patient reports intermittent dizzy spells.  She feels like she is going to pass out.  She has hx of mild MVP and question ASD.  Her EKG is normal.  She has f/u appt with her cardiologist 11/20/07.  Her recent electrolytes are normal.  I suspect symptoms may be due to sleep deprivation and stress. Her updated medication list for this problem includes:    Claritin 10 Mg Tabs (Loratadine) .Marland Kitchen... As needed   Problem # 2:  RIB PAIN, RIGHT SIDED (ICD-786.50) Pt strained her ribs while reaching to close her minivan door.  She heard popping noise.  Her CXR is negative for rib fracture.  I suspect rib/intercostal muscle strain.  Samples of  diclofenac gel provided.  Orders: T-2 View CXR, Same Day (71020.5TC) T-Ribs Unilateral 2 Views (71100TC)   Problem # 3:  SINUSITIS (ICD-473.9) Pt with > 1 wk of upper respiratory congestion and cough.  She complains of fascial pressure.  Her updated medication list for this problem includes:    Ceftin 500 Mg Tabs (Cefuroxime axetil) ..... One by mouth two times a day   Problem # 4:  INSOMNIA (ICD-780.52) Pt reports increased stress at home.  Her daughter was recently diagnosed with Crohn's disease.  She feels like she has not slept in several  weeks.  Her updated medication list for this problem includes:    Lunesta 2 Mg Tabs (Eszopiclone) ..... One by mouth at bedtime prn   Problem # 5:  GERD (ICD-530.81) Pt reports uncontrolled GERD.  Increase Nexium to two times a day. Her updated medication list for this problem includes:    Nexium 40 Mg Cpdr (Esomeprazole magnesium) ..... One by mouth  two times a day   Complete Medication List: 1)  Adult Aspirin Low Strength 81 Mg Tbdp (Aspirin) .... Once daily 2)  Nexium 40 Mg Cpdr (Esomeprazole magnesium) .... One by  mouth  two times a day 3)  Tylenol 325 Mg Tabs (Acetaminophen) .... As needed 4)  Claritin 10 Mg Tabs (Loratadine) .... As needed 5)  Lunesta 2 Mg Tabs (Eszopiclone) .... One by mouth at bedtime prn 6)  Ceftin 500 Mg Tabs (Cefuroxime axetil) .... One by mouth two times a day  Other Orders: EKG w/ Interpretation (93000)   Patient Instructions: 1)  Please schedule a follow-up appointment in 6 weeks.    Prescriptions: CEFTIN 500 MG  TABS (CEFUROXIME AXETIL) one by mouth two times a day  #20 x 0   Entered and Authorized by:   D. Thomos Lemons DO   Signed by:   D. Thomos Lemons DO on 11/07/2007   Method used:   Electronically sent to ...       [facility]*       [address]       [place], [place]  [address]       Ph: [phone] or [phone]       Fax: [phone]   RxID:   [id number] LUNESTA 2 MG  TABS (ESZOPICLONE) one by mouth at bedtime prn  #30 x 3   Entered and Authorized by:   D. Thomos Lemons DO   Signed by:   D. Thomos Lemons DO on 11/07/2007   Method used:   Print then Give to Patient   RxID:   [id number] NEXIUM 40 MG  CPDR (ESOMEPRAZOLE MAGNESIUM) one by mouth  two times a day  #60 x 5   Entered and Authorized by:   D. Thomos Lemons DO   Signed by:   D. Thomos Lemons DO on 11/07/2007   Method used:   Electronically sent to ...       [facility]*       [address]       [place], [place]  [address]       Ph: [phone] or [phone]       Fax: [phone]   RxID:   [id number]  ]  Preventive Care Screening  Mammogram:    Date:  09/17/2007    Results:  normal

## 2010-09-30 NOTE — Letter (Signed)
[facility]   Imported By: Lanelle Bal 04/27/2009 09:02:49  _____________________________________________________________________  External Attachment:    Type:   Image     Comment:   External Document

## 2010-10-01 NOTE — Letter (Signed)
Summary: Stacey Drain, MD Rheumatology  Stacey Drain, MD Rheumatology   Imported By: Lanelle Bal 04/14/2009 11:45:48  _____________________________________________________________________  External Attachment:    Type:   Image     Comment:   External Document

## 2010-12-14 ENCOUNTER — Other Ambulatory Visit: Payer: Self-pay | Admitting: Internal Medicine

## 2010-12-14 ENCOUNTER — Other Ambulatory Visit: Payer: Self-pay

## 2010-12-14 DIAGNOSIS — Z0000 Encounter for general adult medical examination without abnormal findings: Secondary | ICD-10-CM

## 2010-12-15 ENCOUNTER — Other Ambulatory Visit (INDEPENDENT_AMBULATORY_CARE_PROVIDER_SITE_OTHER): Payer: BC Managed Care – PPO | Admitting: Internal Medicine

## 2010-12-15 ENCOUNTER — Other Ambulatory Visit (INDEPENDENT_AMBULATORY_CARE_PROVIDER_SITE_OTHER): Payer: BC Managed Care – PPO

## 2010-12-15 DIAGNOSIS — Z0000 Encounter for general adult medical examination without abnormal findings: Secondary | ICD-10-CM

## 2010-12-15 DIAGNOSIS — Z13220 Encounter for screening for lipoid disorders: Secondary | ICD-10-CM

## 2010-12-15 LAB — CBC WITH DIFFERENTIAL/PLATELET
Basophils Absolute: 0 10*3/uL (ref 0.0–0.1)
Basophils Relative: 0.6 % (ref 0.0–3.0)
Eosinophils Absolute: 0.2 10*3/uL (ref 0.0–0.7)
Eosinophils Relative: 4.5 % (ref 0.0–5.0)
HCT: 39.6 % (ref 36.0–46.0)
Hemoglobin: 13.4 g/dL (ref 12.0–15.0)
Lymphocytes Relative: 43.5 % (ref 12.0–46.0)
Lymphs Abs: 1.9 10*3/uL (ref 0.7–4.0)
MCHC: 33.8 g/dL (ref 30.0–36.0)
MCV: 99.8 fl (ref 78.0–100.0)
Monocytes Absolute: 0.4 10*3/uL (ref 0.1–1.0)
Monocytes Relative: 8.6 % (ref 3.0–12.0)
Neutro Abs: 1.9 10*3/uL (ref 1.4–7.7)
Neutrophils Relative %: 42.8 % — ABNORMAL LOW (ref 43.0–77.0)
Platelets: 218 10*3/uL (ref 150.0–400.0)
RBC: 3.96 Mil/uL (ref 3.87–5.11)
RDW: 12.2 % (ref 11.5–14.6)
WBC: 4.4 10*3/uL — ABNORMAL LOW (ref 4.5–10.5)

## 2010-12-15 LAB — BASIC METABOLIC PANEL
BUN: 11 mg/dL (ref 6–23)
CO2: 30 mEq/L (ref 19–32)
Calcium: 9.8 mg/dL (ref 8.4–10.5)
Chloride: 101 mEq/L (ref 96–112)
Creatinine, Ser: 0.8 mg/dL (ref 0.4–1.2)
GFR: 79.3 mL/min (ref >60.00)
Glucose, Bld: 87 mg/dL (ref 70–99)
Potassium: 4.3 mEq/L (ref 3.5–5.1)
Sodium: 139 mEq/L (ref 135–145)

## 2010-12-15 LAB — TSH: TSH: 1.89 u[IU]/mL (ref 0.35–5.50)

## 2010-12-15 LAB — URINALYSIS, ROUTINE W REFLEX MICROSCOPIC
Bilirubin Urine: NEGATIVE
Hgb urine dipstick: NEGATIVE
Ketones, ur: NEGATIVE
Nitrite: NEGATIVE
Specific Gravity, Urine: 1.01 (ref 1.000–1.030)
Total Protein, Urine: NEGATIVE
Urine Glucose: NEGATIVE
Urobilinogen, UA: 0.2 (ref 0.0–1.0)
pH: 7.5 (ref 5.0–8.0)

## 2010-12-15 LAB — LIPID PANEL
Cholesterol: 263 mg/dL — ABNORMAL HIGH (ref 0–200)
HDL: 99.2 mg/dL (ref >39.00)
Total CHOL/HDL Ratio: 3
Triglycerides: 47 mg/dL (ref 0.0–149.0)
VLDL: 9.4 mg/dL (ref 0.0–40.0)

## 2010-12-15 LAB — HEPATIC FUNCTION PANEL
ALT: 23 U/L (ref 0–35)
AST: 26 U/L (ref 0–37)
Albumin: 4.3 g/dL (ref 3.5–5.2)
Alkaline Phosphatase: 36 U/L — ABNORMAL LOW (ref 39–117)
Bilirubin, Direct: 0.1 mg/dL (ref 0.0–0.3)
Total Bilirubin: 1.1 mg/dL (ref 0.3–1.2)
Total Protein: 7.1 g/dL (ref 6.0–8.3)

## 2010-12-15 LAB — LDL CHOLESTEROL, DIRECT: Direct LDL: 144.4 mg/dL

## 2010-12-18 ENCOUNTER — Encounter: Payer: Self-pay | Admitting: Internal Medicine

## 2010-12-21 ENCOUNTER — Ambulatory Visit (INDEPENDENT_AMBULATORY_CARE_PROVIDER_SITE_OTHER): Payer: BC Managed Care – PPO | Admitting: Internal Medicine

## 2010-12-21 ENCOUNTER — Encounter: Payer: Self-pay | Admitting: Internal Medicine

## 2010-12-21 VITALS — BP 120/80 | HR 77 | Temp 98.4°F | Resp 16 | Wt 133.0 lb

## 2010-12-21 DIAGNOSIS — E559 Vitamin D deficiency, unspecified: Secondary | ICD-10-CM

## 2010-12-21 DIAGNOSIS — K219 Gastro-esophageal reflux disease without esophagitis: Secondary | ICD-10-CM

## 2010-12-21 DIAGNOSIS — Z0000 Encounter for general adult medical examination without abnormal findings: Secondary | ICD-10-CM

## 2010-12-21 NOTE — Progress Notes (Signed)
Subjective:    Patient ID: Katrina [name], female    DOB: 07/16/1961, 50 y.o.   MRN: [id number]  HPI  50 y/o female with hx of raynaud's, and gerd for routine cpx. No significant interval med hx.   Pt seen by rheumatologist.  Dr. Phylliss Bob repeated antiphospholipid panel which was reported normal.  Pt still occ gets bluish finger.  GERD - taking PPI regularly.  No dysphagia   Review of Systems   Constitutional: Negative for activity change, appetite change and unexpected weight change.  Eyes: Negative for visual disturbance.  Respiratory: Negative for cough, chest tightness and shortness of breath.   Cardiovascular: Negative for chest pain.  Genitourinary: Negative for difficulty urinating.  Neurological: Negative for headaches.  Gastrointestinal: Negative for abdominal pain, heartburn melena or hematochezia Psych: Negative for depression or anxiety     Past Medical History  Diagnosis Date  . GERD (gastroesophageal reflux disease)   . Hypertension   . Osteopenia   . Raynaud disease     question false positive antiphospholipid panel  . Vitamin D deficiency   . History of abnormal Pap smear   . Frozen shoulder     history of bilateral frozen shoulder    History   Social History  . Marital Status: Married    Spouse Name: N/A    Number of Children: N/A  . Years of Education: N/A   Occupational History  . Technical sales engineer Other   Social History Main Topics  . Smoking status: Former Games developer  . Smokeless tobacco: Not on file  . Alcohol Use: Yes  . Drug Use: Not on file  . Sexually Active: Not on file   Other Topics Concern  . Not on file   Social History Narrative   Married   3 children    Occupation:  Technical sales engineer     Former Smoker    Alcohol use-yes - (1-2 glasses of wine per night)    No past surgical history on file.  Family History  Problem Relation Age of Onset  . Anemia Father   . Colon cancer Mother     history of     No Known Allergies  Current  Outpatient Prescriptions on File Prior to Visit  Medication Sig Dispense Refill  . B Complex Vitamins (B COMPLEX 100 PO) Take by mouth daily.        . Cholecalciferol (VITAMIN D) 2000 UNITS CAPS Take 2,000 Units by mouth daily.        Marland Kitchen esomeprazole (NEXIUM) 40 MG capsule Take 40 mg by mouth 2 (two) times daily.        . temazepam (RESTORIL) 15 MG capsule Take 15 mg by mouth at bedtime as needed.          BP 120/80  Pulse 77  Temp(Src) 98.4 F (36.9 C) (Oral)  Resp 16  Wt 133 lb (60.328 kg)  SpO2 100%      Objective:   Physical Exam   Constitutional: Appears well-developed and well-nourished. No distress.  HENT:  Head: Normocephalic and atraumatic.  Right Ear: External ear normal.  Left Ear: External ear normal.  Mouth/Throat: Oropharynx is clear and moist.  Eyes: Conjunctivae are normal. Pupils are equal, round, and reactive to light.  Neck: Normal range of motion. Neck supple. No thyromegaly present.       No carotid bruit  Cardiovascular: Normal rate, regular rhythm and normal heart sounds.  Exam reveals no gallop and no friction rub.   No murmur heard. Pulmonary/Chest:  Effort normal and breath sounds normal.  No wheezes. No rales.  Abdominal: Soft. Bowel sounds are normal. No mass. There is no tenderness.  Neurological: Alert. No cranial nerve deficit.  Skin: Skin is warm and dry.  Psychiatric: Normal mood and affect. Behavior is normal.         Assessment & Plan:

## 2010-12-21 NOTE — Patient Instructions (Addendum)
Take ranitidine 150 mg twice daily for 2-4 as you stop taking Nexium. Use over the counter melatonin 2 mg as directed

## 2010-12-22 ENCOUNTER — Other Ambulatory Visit: Payer: Self-pay | Admitting: Internal Medicine

## 2010-12-22 LAB — VITAMIN D 25 HYDROXY (VIT D DEFICIENCY, FRACTURES): Vit D, 25-Hydroxy: 53 ng/mL (ref 30–89)

## 2011-01-10 ENCOUNTER — Encounter: Payer: Self-pay | Admitting: Internal Medicine

## 2011-01-10 NOTE — Assessment & Plan Note (Signed)
I suggest transition off of  PPI to H2 blocker. Then use OTC zantac as needed Reviewed antireflux measures. If GERD symptoms gets worse, pt can restart nexium

## 2011-01-10 NOTE — Assessment & Plan Note (Signed)
Reviewed adult health maintenance protocols. PAP and Pelvic performed by GYN Colonoscopy and mammogram up to date

## 2011-01-14 NOTE — Op Note (Signed)
Katrina [name], Katrina [name]     ACCOUNT NO.:  [id number]   MEDICAL RECORD NO.:  [id number]          PATIENT TYPE:  AMB   LOCATION:  DSC                          FACILITY:  [facility]   PHYSICIAN:  Lubertha Basque. Dalldorf, M.D.DATE OF BIRTH:  August 06, 1961   DATE OF PROCEDURE:  12/06/2005  DATE OF DISCHARGE:                                 OPERATIVE REPORT   PREOPERATIVE DIAGNOSIS:  Left foot Morton's neuroma.   POSTOPERATIVE DIAGNOSIS:  Left foot Morton's neuroma.   PROCEDURE:  Excision left foot Morton's neuroma.   ANESTHESIA:  General and block.   SURGEON:  Lubertha Basque. Jerl Santos, M.D.   INDICATIONS FOR PROCEDURE:  The patient is a 50 year old woman with a long  history of a painful left foot.  She has persisted with difficulty despite  injections in the third interspace on two occasions and relegation to fairly  wide shoes.  She has pain which limits her ability to wear dress shoes and  to walk without discomfort.  She is offered excision of a presumed Morton's  neuroma in the third interspace of her left foot.  Informed operative  consent was obtained discussion about the possible complications of reaction  to anesthesia, infection, neurovascular injury.  The patient understood that  she would likely be numb forever in this interspace.   DESCRIPTION OF PROCEDURE:  The patient was taken to the operating suite  where general anesthetic was induced without difficulty.  She was also given  a block in the preanesthesia area.  She was positioned supine and prepped  and draped in the normal sterile fashion.  After administration of IV  Kefzol, the left leg was elevated, exsanguinated, and a tourniquet inflated  about the calf.  A dorsal incision was made in the left foot third  interspace.  Dissection was carried down to the transverse metatarsal  ligament.  This was incised and an obvious neuroma came into view.  The  neuroma was excised proximally just before the level of the metatarsal head  and taken out distally past the bifurcation at each toe.  The neuroma was  then sent to pathology in formalin.  The tourniquet was deflated and a small  amount of bleeding was easily controlled with Bovie cautery.  Her toes  became pink and warm immediately.  The wound was irrigated followed by  reapproximation of the skin with nylon.  Some Adaptic was applied followed  by dry gauze and a loose Ace wrap.  Estimated blood loss and fluids as well  as accurate tourniquet time can be obtained from anesthesia records.   DISPOSITION:  The patient was extubated in the operating room and taken to  the recovery in stable addition.  The plans were for her to go home the same  day and follow up in the office next week.  I will contact her by phone  tonight.     Lubertha Basque Jerl Santos, M.D.  Electronically Signed    PGD/MEDQ  D:  12/06/2005  T:  12/06/2005  Job:  [id number]

## 2011-01-14 NOTE — Op Note (Signed)
Katrina [name], Katrina [name]     ACCOUNT NO.:  [id number]   MEDICAL RECORD NO.:  [id number]          PATIENT TYPE:  AMB   LOCATION:  DSC                          FACILITY:  [facility]   PHYSICIAN:  Lubertha Basque. Dalldorf, M.D.DATE OF BIRTH:  31-Jul-1961   DATE OF PROCEDURE:  07/05/2005  DATE OF DISCHARGE:                                 OPERATIVE REPORT   PREOP DIAGNOSIS:  1.  Left knee torn medial meniscus.  2.  Left knee chondromalacia.  3.  Left foot Morton's neuroma.   POSTOP DIAGNOSIS:  1.  Left knee torn medial meniscus.  2.  Left knee chondromalacia.  3.  Left foot Morton's neuroma.   PROCEDURES:  1.  Left knee partial medial meniscectomy.  2.  Left knee chondroplasty and abrasion.  3.  Left foot interspace injection.   ANESTHESIA:  General.   ATTENDING SURGEON:  Lubertha Basque. Jerl Santos, M.D.   ASSISTANT:  Lindwood Qua, P.A.   INDICATIONS FOR PROCEDURE:  The patient is a 50 year old woman with a long  history of left knee pain. This has persisted despite oral anti-  inflammatories, activity restriction, and an injection. She has had two MRI  scans done, years apart. The first did show meniscus tear which we decided  to observe, but then her pain became worse. The subsequent scan showed a  more impressive complex meniscus tear with an associated Baker's cyst. She  has also felt some degenerative change. She is offered an arthroscopy at  this point. She also has had trouble with the Morton's neuroma in the third  interspace of the left foot. We have injected, that in the past, and she is  offered a repeat injection here, under the same anesthetic. Informed  operative consent was obtained after discussion of possible complications of  reaction to anesthesia and infection.   DESCRIPTION OF PROCEDURE:  The patient was brought to the operating suite  where general anesthetic was applied without difficulty. She was positioned  supine and prepped and draped in a normal sterile  fashion. After the  administration of IV antibiotic, an arthroscopy of the left knee was  performed through two inferior portals. Suprapatellar pouch was benign while  the patellofemoral joint exhibited some mild chondromalacia dressed with a  brief chondroplasty. In the medial compartment she did have a complex tear  of the posterior and middle horn of the meniscus. A 15%, partial,  medial  meniscectomy was required back to stable tissue. This was done with the  shaver and basket. I did not see much in the way of degenerative changes, in  this compartment, other than grade 1 and 2 findings. The ACL appeared to be  intact; and the lateral compartment was completely benign with no evidence  of meniscal or articular cartilage injury. The knee was thoroughly  irrigated, followed by placement of Marcaine with epinephrine and morphine.  Adaptic was placed over the portals followed by dry gauze and a loose Ace  wrap. We then injected the third interspace of the left foot with 1 mL of  Depo-Medrol in 1 mL of lidocaine. A total of 40 mg of Depo were placed. A  Band-Aid was  applied here. Estimated blood loss and intraoperative fluids  can be obtained from anesthesia records.   DISPOSITION:  The patient was extubated in the operating room, and taken to  recovery room in stable addition. Plans were for her to go home the same day  and to followup in the office in less than a week. I will contact her by  phone tonight.      Lubertha Basque Jerl Santos, M.D.  Electronically Signed     PGD/MEDQ  D:  07/05/2005  T:  07/05/2005  Job:  [id number]

## 2011-04-11 ENCOUNTER — Encounter: Payer: Self-pay | Admitting: Internal Medicine

## 2011-04-11 ENCOUNTER — Ambulatory Visit (INDEPENDENT_AMBULATORY_CARE_PROVIDER_SITE_OTHER): Payer: BC Managed Care – PPO | Admitting: Internal Medicine

## 2011-04-11 VITALS — BP 128/82 | HR 70 | Temp 97.8°F | Resp 12 | Ht 66.0 in | Wt 131.0 lb

## 2011-04-11 DIAGNOSIS — N951 Menopausal and female climacteric states: Secondary | ICD-10-CM

## 2011-04-11 DIAGNOSIS — N393 Stress incontinence (female) (male): Secondary | ICD-10-CM

## 2011-04-11 DIAGNOSIS — R87619 Unspecified abnormal cytological findings in specimens from cervix uteri: Secondary | ICD-10-CM

## 2011-04-11 DIAGNOSIS — I341 Nonrheumatic mitral (valve) prolapse: Secondary | ICD-10-CM | POA: Insufficient documentation

## 2011-04-11 DIAGNOSIS — I059 Rheumatic mitral valve disease, unspecified: Secondary | ICD-10-CM

## 2011-04-11 DIAGNOSIS — E785 Hyperlipidemia, unspecified: Secondary | ICD-10-CM

## 2011-04-11 LAB — LIPID PANEL
Cholesterol: 249 mg/dL — ABNORMAL HIGH (ref 0–200)
HDL: 92 mg/dL (ref >39)
LDL Cholesterol: 134 mg/dL — ABNORMAL HIGH (ref 0–99)
Total CHOL/HDL Ratio: 2.7 Ratio
Triglycerides: 117 mg/dL (ref <150)
VLDL: 23 mg/dL (ref 0–40)

## 2011-04-11 NOTE — Patient Instructions (Signed)
Schedule CPE Take Ibuprofen as needed

## 2011-04-11 NOTE — Progress Notes (Signed)
  Subjective:    Patient ID: Katrina [name], female    DOB: 1961/03/18, 50 y.o.   MRN: [id number]  HPI  New pt here for first visit.  Former pt. of Dr. Artist Pais.  See Problem list.  [name] describes that one week ago she was lifting herself from a lounge chair and felt a "pop"  In her R side of lower rib area.  She had significant pain  and pain with breathing for 1-2 days but it has markedly improved now.  She had similar episode 2 years ago in same area and all X-rays were negative so she did not worry too much this time.  Took Ibuprofen but has not needed any last few days.  Breathing fine now.    Significant menopausal flushing day and night but has mother with breast cancer and does not want hormones.    GERD  Off Nexium since April of this year.  Not many symptoms  Had normal pap by Dr. Rana Snare last visit.  Is due for repeat pap in 4 weeks.  LMP  Summer of 2011    Was told by Dr. Artist Pais last visit that she had a high cholesterol.  She is fasting today   Review of Systems See HPI    Objective:   Physical Exam  Physical Exam  Nursing note and vitals reviewed.  Constitutional: She is oriented to person, place, and time. She appears well-developed and well-nourished.  HENT:  Head: Normocephalic and atraumatic.  Cardiovascular: Normal rate and regular rhythm. Exam reveals no gallop and no friction rub.  No murmur heard.  Pulmonary/Chest: Breath sounds normal. She has no wheezes. She has no rales.  Good bilateral breath sounds.  She has mild point tenderness near rib 10-11 on the right.  No bruising of chest wall  Neurological: She is alert and oriented to person, place, and time.  Skin: Skin is warm and dry.  Psychiatric: She has a normal mood and affect. Her behavior is normal.           Assessment & Plan:  1)  R sided chest wall pain:  Likely musculoskeletal injury.  I offered X-ray today but pt declined.  Doubt fracture.  Likley intercostal muscle or tendon strain  Okay for  OTC Ibuprofen 2)  Symptomatic menopause:  Will discuss non hormone options future visit 3) Hyperlipidemia  Will check today 3) GERD  Off long term Nexium now 4)  Abnormal pap:  Due for repeat pap in 4 weeks 5)  See Problem list

## 2011-10-26 ENCOUNTER — Ambulatory Visit (INDEPENDENT_AMBULATORY_CARE_PROVIDER_SITE_OTHER): Payer: BC Managed Care – PPO | Admitting: Cardiovascular Disease

## 2011-10-26 ENCOUNTER — Encounter: Payer: Self-pay | Admitting: Cardiovascular Disease

## 2011-10-26 VITALS — BP 129/88 | HR 72 | Ht 66.0 in | Wt 136.8 lb

## 2011-10-26 DIAGNOSIS — I10 Essential (primary) hypertension: Secondary | ICD-10-CM

## 2011-10-26 NOTE — Progress Notes (Signed)
Katrina [name] Date of Birth  10-Sep-1960 [facility]     [place]  [address]    [address]   [address] [place], [place]  [address]    [place], [place]  [address] [phone]  Fax  [phone]  [phone]  Fax [phone]  Problem List: 1. Hypetension   History of Present Illness:  Katrina [name] is a 51 yo who presents today for evaluation of HTN.  She was noted to have elevation of her diastolic blood pressure.  She's also been having some dyspnea with exertion. She went hiking at the [facility] [name] recently and had significant shortness breath climbing up the trail.  She feels like her metabolism has slowed recently.  She walks her dogs 1 1/2 miles a day without problems.  She drinks a couple of glasses of wine each night.  Current Outpatient Prescriptions on File Prior to Visit  Medication Sig Dispense Refill  . aspirin 81 MG tablet Take 81 mg by mouth daily.        . B Complex Vitamins (B COMPLEX 100 PO) Take by mouth daily.       . Cholecalciferol (VITAMIN D) 2000 UNITS CAPS Take 2,000 Units by mouth daily.        . temazepam (RESTORIL) 15 MG capsule Take 15 mg by mouth at bedtime as needed.          No Known Allergies  Past Medical History  Diagnosis Date  . GERD (gastroesophageal reflux disease)   . Osteopenia   . Raynaud disease     question false positive antiphospholipid panel  . Vitamin d deficiency   . History of abnormal Pap smear     S/P lazer conization  . Menopause   . Mitral valve prolapse     no report of murmur  . Frozen shoulder     history of bilateral frozen shoulder  . Family history of breast cancer in first degree relative     Past Surgical History  Procedure Date  . Knee surgery 2006    left  . Foot surgery 2007    left    History  Smoking status  . Former Smoker -- 1.0 packs/day for 6 years  . Types: Cigarettes  . Quit date: 07/02/1991  Smokeless tobacco  . Never Used    History  Alcohol  Use  . 8.4 oz/week  . 14 Glasses of wine per week    Family History  Problem Relation Age of Onset  . Anemia Father   . Heart disease Father   . Colon cancer Mother     history of   . Breast cancer Mother   . Cancer Mother     carcinoid- colon  . Breast cancer Maternal Aunt   . Cancer Paternal Aunt     brain  . Heart disease Maternal Grandmother   . Cancer Maternal Grandfather     throat    Reviw of Systems:  Reviewed in the HPI.  All other systems are negative.  Physical Exam: Blood pressure 129/88, pulse 72, height 5\' 6"  (1.676 m), weight 136 lb 12.8 oz (62.052 kg). General: Well developed, well nourished, in no acute distress.  Head: Normocephalic, atraumatic, sclera non-icteric, mucus membranes are moist,   Neck: Supple. Carotids are 2 + without bruits. No JVD  Lungs: Clear bilaterally to auscultation.  Heart: regular rate  has a very soft midsystolic click. There is no significant murmur.  Abdomen: Soft, non-tender, non-distended  with normal bowel sounds. No hepatomegaly. No rebound/guarding. No masses.  Msk:  Strength and tone are normal  Extremities: No clubbing or cyanosis. No edema.  Distal pedal pulses are 2+ and equal bilaterally.  Neuro: Alert and oriented X 3. Moves all extremities spontaneously.  Psych:  Responds to questions appropriately with a normal affect.  ECG: Normal sinus rhythm. She was increased voltage is in leads 2 in the chest leads. This high voltage is because she is thin.  Assessment / Plan:

## 2011-10-26 NOTE — Assessment & Plan Note (Signed)
We had an extensive discussion about possible causes of her hypertension. She does not eat a lot of excessive salt but they eat some slightly. She drinks a couple glasses of wine at night which may be contributing to her high blood pressure. She also admits that she's not sleeping well at night and this could certainly be contributing to her hypertension.  She may also have sleep apnea. She states that she snores sometimes and she occasionally wakes up in the middle of the night gasping for air.  She is not obese and has not gained any weight.  We discussed the possibility that she may need a sleep study at some point.  LC her again in the office in 3 months. In the meantime, She'll try to exercise a little bit more vigorously. I've asked her to possibly decrease her  wine at night which may actually help her sleep better and may help with her hypertension. I'll see her again in several months. If her blood pressure still elevated then we'll start her on either low-dose HCTZ and potassium or perhaps losartan.

## 2011-10-26 NOTE — Patient Instructions (Signed)
Your physician recommends that you schedule a follow-up appointment in: 3 MONTHS FOR HTN  Your physician recommends that you continue on your current medications as directed. Please refer to the Current Medication list given to you today.

## 2012-01-27 ENCOUNTER — Ambulatory Visit: Payer: BC Managed Care – PPO | Admitting: Cardiovascular Disease

## 2012-06-01 ENCOUNTER — Telehealth: Payer: Self-pay | Admitting: Gastroenterology

## 2012-06-01 NOTE — Telephone Encounter (Signed)
Patient with a 3 week history of change in bowel habits and mucous in her stool.  She has burning in her lower abdomen with BM and constipation.  She will come see Willette Cluster RNP on Monday at 2:00

## 2012-06-04 ENCOUNTER — Ambulatory Visit (INDEPENDENT_AMBULATORY_CARE_PROVIDER_SITE_OTHER): Payer: BC Managed Care – PPO | Admitting: Nurse Practitioner

## 2012-06-04 ENCOUNTER — Other Ambulatory Visit (INDEPENDENT_AMBULATORY_CARE_PROVIDER_SITE_OTHER): Payer: BC Managed Care – PPO

## 2012-06-04 ENCOUNTER — Encounter: Payer: Self-pay | Admitting: Nurse Practitioner

## 2012-06-04 VITALS — BP 138/78 | HR 68 | Ht 66.0 in | Wt 139.2 lb

## 2012-06-04 DIAGNOSIS — R635 Abnormal weight gain: Secondary | ICD-10-CM

## 2012-06-04 DIAGNOSIS — K602 Anal fissure, unspecified: Secondary | ICD-10-CM

## 2012-06-04 DIAGNOSIS — K5900 Constipation, unspecified: Secondary | ICD-10-CM

## 2012-06-04 LAB — TSH: TSH: 2.3 u[IU]/mL (ref 0.35–5.50)

## 2012-06-04 MED ORDER — DILTIAZEM GEL 2 %: CUTANEOUS | Status: DC

## 2012-06-04 NOTE — Progress Notes (Signed)
06/04/2012 Katrina [name] [id number] 1961/03/29   HISTORY OF PRESENT ILLNESS:  Patient is a 51 year old female with a history of HTN who had a screening colonoscopy in January 2010 by Dr. Russella Dar. She has a history of colon cancer in mother at age 60. Colonoscopy was normal. She was felt at one time to have a blood clotting disorder / lupus anticoagulant but further workup was negative. She does carry a diagnosis of Raynauld's.   Patient is here for evaluation of constipation. She has had a lifelong problem with constipation.  Despite eating ample fruits and vegetables her constipation is getting worse. She is avid exerciser. No new medications, she consumes plenty of water.   She has recently gained 7 pounds. She is current with GYN exams.      Past Medical History  Diagnosis Date  . GERD (gastroesophageal reflux disease)   . Osteopenia   . Raynaud disease     question false positive antiphospholipid panel  . Vitamin D deficiency   . History of abnormal Pap smear     S/P lazer conization  . Menopause   . Mitral valve prolapse     no report of murmur  . Frozen shoulder     history of bilateral frozen shoulder  . Family history of breast cancer in first degree relative    Past Surgical History  Procedure Date  . Knee surgery 2006    left  . Foot surgery     left  . Wisdom tooth extraction 1981    reports that she quit smoking about 20 years ago. Her smoking use included Cigarettes. She has a 6 pack-year smoking history. She has never used smokeless tobacco. She reports that she drinks about 8.4 ounces of alcohol per week. She reports that she does not use illicit drugs. family history includes Anemia in her father; Breast cancer in her maternal aunt and mother; Cancer in her maternal grandfather and paternal aunt; Colon cancer in her mother; Heart disease in her father and maternal grandmother; and Stroke in her sister. No Known Allergies    Outpatient Encounter  Prescriptions as of 06/04/2012  Medication Sig Dispense Refill  . aspirin 81 MG tablet Take 81 mg by mouth daily.        . B Complex Vitamins (B COMPLEX 100 PO) Take by mouth daily.       . Cholecalciferol (VITAMIN D) 2000 UNITS CAPS Take 2,000 Units by mouth daily.        . temazepam (RESTORIL) 15 MG capsule Take 15 mg by mouth at bedtime as needed.           REVIEW OF SYSTEMS  : Positive for arthritis, back pain, vision changes, fatigue, headaches, night sweats, shortness of breath, sleeping problems, swelling of feet, urine leakage. All other systems reviewed and negative except where noted in the History of Present Illness.   PHYSICAL EXAM: BP 138/78  Pulse 68  Ht 5\' 6"  (1.676 m)  Wt 139 lb 3.2 oz (63.141 kg)  BMI 22.47 kg/m2 General: Well developed white female in no acute distress Head: Normocephalic and atraumatic Eyes:  sclerae anicteric,conjunctive pink. Ears: Normal auditory acuity Neck: Supple, no masses.  Lungs: Clear throughout to auscultation Heart: Regular rate and rhythm Abdomen: Soft, nontender, non distended. No masses or hepatomegaly noted. Normal bowel sounds Rectal:  Midline, posterior anal fissure. Musculoskeletal: Symmetrical with no gross deformities  Skin: No lesions on visible extremities Extremities: No edema  Neurological: Alert oriented x 4, grossly nonfocal  Cervical Nodes:  No significant cervical adenopathy Psychological:  Alert and cooperative. Normal mood and affect  ASSESSMENT AND PLAN:  1. Chronic constipation, slightly worse lately. She has gained weight. Will check TSH. Trial of Miralax 1-2 times daily for constipation  2. Anal fissure (posterior midline) just inside anal canal. Trial of Diltiazem Gel TID. Return for a recheck in 3-4 weeks. Treat constipation

## 2012-06-04 NOTE — Patient Instructions (Addendum)
Please go to the basement level to have your labs drawn.  We sent a prescription for Diltiazem Gel 2 %.  Apply rectally, just inside the anus 3 times daily. Take Miralax 17 grams twice daily .  If you are not having good bowel movements within a week, we will call in a prescription for you. Please call us to let us know at [phone].  Follow up in 1 month to recheck the fissure. We made you an appointment with Dr. Claudette Head for 07-04-2012 at 9:15 AM.

## 2012-06-06 NOTE — Progress Notes (Signed)
Reviewed and agree with management plans.  Malcolm T. Stark MD FACG  

## 2012-07-04 ENCOUNTER — Ambulatory Visit: Payer: BC Managed Care – PPO | Admitting: Gastroenterology

## 2012-07-06 ENCOUNTER — Encounter: Payer: Self-pay | Admitting: Cardiovascular Disease

## 2012-07-06 ENCOUNTER — Ambulatory Visit (INDEPENDENT_AMBULATORY_CARE_PROVIDER_SITE_OTHER): Payer: BC Managed Care – PPO | Admitting: Cardiovascular Disease

## 2012-07-06 VITALS — BP 136/86 | HR 85 | Ht 66.0 in | Wt 133.0 lb

## 2012-07-06 DIAGNOSIS — I10 Essential (primary) hypertension: Secondary | ICD-10-CM

## 2012-07-06 MED ORDER — LOSARTAN POTASSIUM 50 MG PO TABS: 50.0000 mg | ORAL_TABLET | Freq: Every day | ORAL | Status: DC

## 2012-07-06 NOTE — Assessment & Plan Note (Signed)
Katrina [name] presents with persistent elevated diastolic blood pressure. We'll start her on losartan 50 mg a day. We'll check her basic metabolic profile in 3-4 weeks. I'll see her again in 3 months for followup visit as well as basic metabolic profile.  She's exercising on a regular basis.

## 2012-07-06 NOTE — Patient Instructions (Addendum)
Your physician recommends that you return for lab work in: 3-4 weeks  Your physician recommends that you schedule a follow-up appointment in: 3 months   Your physician has recommended you make the following change in your medication:  START LOSARTAN 50 MG DAILY

## 2012-07-06 NOTE — Progress Notes (Signed)
Katrina [name] Date of Birth  [birth year] [facility]     [place]  [address]    [address]   [address] [place], [place]  [address]    [place], [place]  [address] [phone]  Fax  [phone]  [phone]  Fax [phone]  Problem List: 1. Hypetension   History of Present Illness:  Katrina [name] is a 51 yo who presents today for evaluation of HTN.  She was noted to have elevation of her diastolic blood pressure.  She's also been having some dyspnea with exertion. She went hiking at the [facility] of [name] recently and had significant shortness breath climbing up the trail.  She feels like her metabolism has slowed recently.  She walks her dogs 2 miles a day without problems.   She has been working out and walking regularly.    Current Outpatient Prescriptions on File Prior to Visit  Medication Sig Dispense Refill  . aspirin 81 MG tablet Take 81 mg by mouth daily.        . temazepam (RESTORIL) 15 MG capsule Take 15 mg by mouth at bedtime as needed.          No Known Allergies  Past Medical History  Diagnosis Date  . GERD (gastroesophageal reflux disease)   . Osteopenia   . Raynaud disease     question false positive antiphospholipid panel  . Vitamin D deficiency   . History of abnormal Pap smear     S/P lazer conization  . Menopause   . Mitral valve prolapse     no report of murmur  . Frozen shoulder     history of bilateral frozen shoulder  . Family history of breast cancer in first degree relative   . Lupus anticoagulant disorder   . Anal fissure   . Diverticulosis     Past Surgical History  Procedure Date  . Knee surgery 2006    left  . Foot surgery     left  . Wisdom tooth extraction 1981    History  Smoking status  . Former Smoker -- 1.0 packs/day for 6 years  . Types: Cigarettes  . Quit date: 07/02/1991  Smokeless tobacco  . Never Used    History  Alcohol Use  . 8.4 oz/week  . 14 Glasses of wine per week     Family History  Problem Relation Age of Onset  . Anemia Father   . Heart disease Father     CHF  . Colon cancer Mother     history of   . Breast cancer Mother   . Breast cancer Maternal Aunt   . Cancer Paternal Aunt     brain  . Heart disease Maternal Grandmother   . Cancer Maternal Grandfather     throat  . Stroke Sister     Reviw of Systems:  Reviewed in the HPI.  All other systems are negative.  Physical Exam: Blood pressure 136/86, pulse 85, height 5\' 6"  (1.676 m), weight 133 lb (60.328 kg), SpO2 99.00%. General: Well developed, well nourished, in no acute distress.  Head: Normocephalic, atraumatic, sclera non-icteric, mucus membranes are moist,   Neck: Supple. Carotids are 2 + without bruits. No JVD  Lungs: Clear bilaterally to auscultation.  Heart: regular rate  has a very soft midsystolic click. There is a soft murmur.  Abdomen: Soft, non-tender, non-distended with normal bowel sounds. No hepatomegaly. No rebound/guarding. No masses.  Msk:  Strength and tone are normal  Extremities: No clubbing or cyanosis. No edema.  Distal pedal pulses are 2+ and equal bilaterally.  Neuro: Alert and oriented X 3. Moves all extremities spontaneously.  Psych:  Responds to questions appropriately with a normal affect.  ECG:  Assessment / Plan:

## 2012-07-20 ENCOUNTER — Telehealth: Payer: Self-pay | Admitting: *Deleted

## 2012-07-20 NOTE — Telephone Encounter (Signed)
Pt started losartan and now noticed a bruise on her knee yesterday and now has a bruise on her finger. She wondered if it was the medication. Medication was reviewed and bleeding was not listed/ pt informed. Pt told to call back with further concerns. She agreed to plan.

## 2012-07-30 ENCOUNTER — Other Ambulatory Visit (INDEPENDENT_AMBULATORY_CARE_PROVIDER_SITE_OTHER): Payer: BC Managed Care – PPO

## 2012-07-30 DIAGNOSIS — I10 Essential (primary) hypertension: Secondary | ICD-10-CM

## 2012-07-30 LAB — BASIC METABOLIC PANEL
BUN: 16 mg/dL (ref 6–23)
CO2: 28 mEq/L (ref 19–32)
Calcium: 9.5 mg/dL (ref 8.4–10.5)
Chloride: 101 mEq/L (ref 96–112)
Creatinine, Ser: 0.9 mg/dL (ref 0.4–1.2)
GFR: 68.03 mL/min (ref >60.00)
Glucose, Bld: 77 mg/dL (ref 70–99)
Potassium: 3.6 mEq/L (ref 3.5–5.1)
Sodium: 139 mEq/L (ref 135–145)

## 2012-09-03 ENCOUNTER — Encounter: Payer: Self-pay | Admitting: Internal Medicine

## 2012-09-03 ENCOUNTER — Ambulatory Visit (INDEPENDENT_AMBULATORY_CARE_PROVIDER_SITE_OTHER): Payer: BC Managed Care – PPO | Admitting: Internal Medicine

## 2012-09-03 VITALS — BP 126/84 | HR 77 | Temp 98.2°F | Resp 20 | Wt 137.0 lb

## 2012-09-03 DIAGNOSIS — R350 Frequency of micturition: Secondary | ICD-10-CM

## 2012-09-03 DIAGNOSIS — R5381 Other malaise: Secondary | ICD-10-CM

## 2012-09-03 DIAGNOSIS — R42 Dizziness and giddiness: Secondary | ICD-10-CM

## 2012-09-03 LAB — POCT URINALYSIS DIPSTICK
Bilirubin, UA: NEGATIVE
Blood, UA: NEGATIVE
Glucose, UA: NEGATIVE
Ketones, UA: NEGATIVE
Leukocytes, UA: NEGATIVE
Nitrite, UA: NEGATIVE
Protein, UA: NEGATIVE
Spec Grav, UA: 1.01
Urobilinogen, UA: NEGATIVE
pH, UA: 5.5

## 2012-09-03 NOTE — Patient Instructions (Signed)
See me in one week or sooner as neeeded

## 2012-09-03 NOTE — Progress Notes (Signed)
Subjective:    Patient ID: Katrina [name], female    DOB: 08/04/1961, 52 y.o.   MRN: [id number]  HPI [name] comes in for acute visit.  She reports she had an episode of dizziness this am which was associated with tingling around her mouth and numbness in fingertips. No visual change no change in speech.  No muscle weakness anywhere.   She denies vertigo or spinning sensation.  She has had some urinary frequecy but no dysuria  She has had recent URI with productive cough and finished a Zpak a few day ago and cough improved.  Dry cough now.  Exposure to both daughters who have had pharyngitis  She also was started on Cozaar 50 mg approx 2 months ago.  No Known Allergies Past Medical History  Diagnosis Date  . GERD (gastroesophageal reflux disease)   . Osteopenia   . Raynaud disease     question false positive antiphospholipid panel  . Vitamin D deficiency   . History of abnormal Pap smear     S/P lazer conization  . Menopause   . Mitral valve prolapse     no report of murmur  . Frozen shoulder     history of bilateral frozen shoulder  . Family history of breast cancer in first degree relative   . Lupus anticoagulant disorder   . Anal fissure   . Diverticulosis   . Hypertension    Past Surgical History  Procedure Date  . Knee surgery 2006    left  . Foot surgery     left  . Wisdom tooth extraction 1981   History   Social History  . Marital Status: Married    Spouse Name: N/A    Number of Children: 3  . Years of Education: N/A   Occupational History  . architect Other  .     Social History Main Topics  . Smoking status: Former Smoker -- 1.0 packs/day for 6 years    Types: Cigarettes    Quit date: 07/02/1991  . Smokeless tobacco: Never Used  . Alcohol Use: 8.4 oz/week    14 Glasses of wine per week  . Drug Use: No  . Sexually Active: Yes   Other Topics Concern  . Not on file   Social History Narrative   Married   3 children    Occupation:   Technical sales engineer     Former Smoker    Alcohol use-yes - (1-2 glasses of wine per night)   Family History  Problem Relation Age of Onset  . Anemia Father   . Heart disease Father     CHF  . Colon cancer Mother     history of   . Breast cancer Mother   . Breast cancer Maternal Aunt   . Cancer Paternal Aunt     brain  . Heart disease Maternal Grandmother   . Cancer Maternal Grandfather     throat  . Stroke Sister    Patient Active Problem List  Diagnosis  . VITAMIN D DEFICIENCY  . MITRAL VALVE PROLAPSE  . RAYNAUD'S DISEASE  . GERD  . OSTEOPENIA  . INSOMNIA  . Preventative health care  . Mitral valve prolapse  . Frozen shoulder  . Symptomatic menopausal or female climacteric states  . Family history of breast cancer in first degree relative  . Abnormal Pap smear of cervix  . Stress incontinence, female  . Hyperlipidemia  . Constipation  . Weight gain  . Anal fissure   Current  Outpatient Prescriptions on File Prior to Visit  Medication Sig Dispense Refill  . aspirin 81 MG tablet Take 81 mg by mouth daily.        Marland Kitchen losartan (COZAAR) 50 MG tablet Take 1 tablet (50 mg total) by mouth daily.  30 tablet  11  . Multiple Vitamin (MULTIVITAMIN) capsule Take 1 capsule by mouth daily.      . polyethylene glycol (MIRALAX / GLYCOLAX) packet Take 17 g by mouth as needed.      . temazepam (RESTORIL) 15 MG capsule Take 15 mg by mouth at bedtime as needed.             Review of Systems    see HPI Objective:   Physical Exam Physical Exam  Nursing note and vitals reviewed.  Orthostatics  118/73 supine  126/84 standing Constitutional: She is oriented to person, place, and time. She appears well-developed and well-nourished.  HENT:  Head: Normocephalic and atraumatic.  TM's canals clear no effusions Cardiovascular: Normal rate and regular rhythm. Exam reveals no gallop and no friction rub.  No murmur heard.  Pulmonary/Chest: Breath sounds normal. She has no wheezes. She has no rales.    Neurological: She is alert and oriented to person, place, and time. CNII-XII intact  Romberg neg Motor 5/5 all groups tested Sensory intact to microfilament Reflexes 2+ symmetric Cerebellar intact ftn  Skin: Skin is warm and dry.  Psychiatric: She has a normal mood and affect. Her behavior is normal.              Assessment & Plan:  Dizziness:  May be multifactorial. Vasovagal,  Dehydration  Recent URI and on BP med.  EKG normal   Clinical exam unrevealing.  Advised to hold BP med for a few days increase fluids  Recheck with me next week or sooner.  Paresthesias  Will moniter for now  Urinary frequendcy  U/A is normal  See me next week or sooner prn

## 2012-09-11 ENCOUNTER — Encounter: Payer: Self-pay | Admitting: Internal Medicine

## 2012-09-11 ENCOUNTER — Ambulatory Visit (INDEPENDENT_AMBULATORY_CARE_PROVIDER_SITE_OTHER): Payer: BC Managed Care – PPO | Admitting: Internal Medicine

## 2012-09-11 VITALS — BP 128/80 | HR 72 | Temp 98.1°F | Resp 18 | Wt 135.0 lb

## 2012-09-11 DIAGNOSIS — R42 Dizziness and giddiness: Secondary | ICD-10-CM

## 2012-09-11 DIAGNOSIS — I059 Rheumatic mitral valve disease, unspecified: Secondary | ICD-10-CM

## 2012-09-11 DIAGNOSIS — I341 Nonrheumatic mitral (valve) prolapse: Secondary | ICD-10-CM

## 2012-09-11 DIAGNOSIS — E559 Vitamin D deficiency, unspecified: Secondary | ICD-10-CM

## 2012-09-11 LAB — COMPREHENSIVE METABOLIC PANEL
ALT: 16 U/L (ref 0–35)
AST: 19 U/L (ref 0–37)
Albumin: 4.7 g/dL (ref 3.5–5.2)
Alkaline Phosphatase: 42 U/L (ref 39–117)
BUN: 11 mg/dL (ref 6–23)
CO2: 28 mEq/L (ref 19–32)
Calcium: 9.8 mg/dL (ref 8.4–10.5)
Chloride: 103 mEq/L (ref 96–112)
Creat: 0.78 mg/dL (ref 0.50–1.10)
Glucose, Bld: 88 mg/dL (ref 70–99)
Potassium: 4.4 mEq/L (ref 3.5–5.3)
Sodium: 139 mEq/L (ref 135–145)
Total Bilirubin: 0.6 mg/dL (ref 0.3–1.2)
Total Protein: 7.2 g/dL (ref 6.0–8.3)

## 2012-09-11 LAB — CBC WITH DIFFERENTIAL/PLATELET
Basophils Absolute: 0 10*3/uL (ref 0.0–0.1)
Basophils Relative: 1 % (ref 0–1)
Eosinophils Absolute: 0.3 10*3/uL (ref 0.0–0.7)
Eosinophils Relative: 7 % — ABNORMAL HIGH (ref 0–5)
HCT: 38.6 % (ref 36.0–46.0)
Hemoglobin: 13.5 g/dL (ref 12.0–15.0)
Lymphocytes Relative: 34 % (ref 12–46)
Lymphs Abs: 1.6 10*3/uL (ref 0.7–4.0)
MCH: 33.3 pg (ref 26.0–34.0)
MCHC: 35 g/dL (ref 30.0–36.0)
MCV: 95.1 fL (ref 78.0–100.0)
Monocytes Absolute: 0.5 10*3/uL (ref 0.1–1.0)
Monocytes Relative: 9 % (ref 3–12)
Neutro Abs: 2.3 10*3/uL (ref 1.7–7.7)
Neutrophils Relative %: 49 % (ref 43–77)
Platelets: 291 10*3/uL (ref 150–400)
RBC: 4.06 MIL/uL (ref 3.87–5.11)
RDW: 13 % (ref 11.5–15.5)
WBC: 4.8 10*3/uL (ref 4.0–10.5)

## 2012-09-11 LAB — LIPID PANEL
Cholesterol: 226 mg/dL — ABNORMAL HIGH (ref 0–200)
HDL: 87 mg/dL (ref >39)
LDL Cholesterol: 123 mg/dL — ABNORMAL HIGH (ref 0–99)
Total CHOL/HDL Ratio: 2.6 Ratio
Triglycerides: 81 mg/dL (ref <150)
VLDL: 16 mg/dL (ref 0–40)

## 2012-09-11 LAB — TSH: TSH: 1.099 u[IU]/mL (ref 0.350–4.500)

## 2012-09-11 NOTE — Progress Notes (Signed)
Subjective:    Patient ID: Katrina [name], female    DOB: 12/23/1960, 52 y.o.   MRN: [id number]  HPI  [name] is here for follow up on her dizziness symptoms.  She is off her antihypertensive now.  She reports she did have a stomach bug after last office visit and now has a dry cough but no further dizziness or numbness   She had one day where index finger of R hand had twitching type movement .  Her finger moved in an abd/adduction motion for a few minutes.  No visual or speech change, no numbness or paresthesias no muscle weakness .  Episode lasted a few minutes and she was wondering if she was magnesium deficient. No FH of parkinsons ,  MS or other neurologic disorders.  She denies tremor or balance problems.  No Known Allergies Past Medical History  Diagnosis Date  . GERD (gastroesophageal reflux disease)   . Osteopenia   . Raynaud disease     question false positive antiphospholipid panel  . Vitamin D deficiency   . History of abnormal Pap smear     S/P lazer conization  . Menopause   . Mitral valve prolapse     no report of murmur  . Frozen shoulder     history of bilateral frozen shoulder  . Family history of breast cancer in first degree relative   . Lupus anticoagulant disorder   . Anal fissure   . Diverticulosis   . Hypertension    Past Surgical History  Procedure Date  . Knee surgery 2006    left  . Foot surgery     left  . Wisdom tooth extraction 1981   History   Social History  . Marital Status: Married    Spouse Name: N/A    Number of Children: 3  . Years of Education: N/A   Occupational History  . architect Other  .     Social History Main Topics  . Smoking status: Former Smoker -- 1.0 packs/day for 6 years    Types: Cigarettes    Quit date: 07/02/1991  . Smokeless tobacco: Never Used  . Alcohol Use: 8.4 oz/week    14 Glasses of wine per week  . Drug Use: No  . Sexually Active: Yes   Other Topics Concern  . Not on file   Social  History Narrative   Married   3 children    Occupation:  Technical sales engineer     Former Smoker    Alcohol use-yes - (1-2 glasses of wine per night)   Family History  Problem Relation Age of Onset  . Anemia Father   . Heart disease Father     CHF  . Colon cancer Mother     history of   . Breast cancer Mother   . Breast cancer Maternal Aunt   . Cancer Paternal Aunt     brain  . Heart disease Maternal Grandmother   . Cancer Maternal Grandfather     throat  . Stroke Sister    Patient Active Problem List  Diagnosis  . VITAMIN D DEFICIENCY  . MITRAL VALVE PROLAPSE  . RAYNAUD'S DISEASE  . GERD  . OSTEOPENIA  . INSOMNIA  . Preventative health care  . Mitral valve prolapse  . Frozen shoulder  . Symptomatic menopausal or female climacteric states  . Family history of breast cancer in first degree relative  . Abnormal Pap smear of cervix  . Stress incontinence, female  . Hyperlipidemia  .  Constipation  . Weight gain  . Anal fissure  . Dizziness   Current Outpatient Prescriptions on File Prior to Visit  Medication Sig Dispense Refill  . aspirin 81 MG tablet Take 81 mg by mouth daily.        . Multiple Vitamin (MULTIVITAMIN) capsule Take 1 capsule by mouth daily.      . temazepam (RESTORIL) 15 MG capsule Take 15 mg by mouth at bedtime as needed.        Marland Kitchen losartan (COZAAR) 50 MG tablet Take 1 tablet (50 mg total) by mouth daily.  30 tablet  11  . polyethylene glycol (MIRALAX / GLYCOLAX) packet Take 17 g by mouth as needed.          Review of Systems    see HPI Objective:   Physical Exam Physical Exam  Nursing note and vitals reviewed.  Constitutional: She is oriented to person, place, and time. She appears well-developed and well-nourished.  HENT:  Head: Normocephalic and atraumatic.  Cardiovascular: Normal rate and regular rhythm. Exam reveals no gallop and no friction rub.  No murmur heard.  Pulmonary/Chest: Breath sounds normal. She has no wheezes. She has no rales.    Neurological: She is alert and oriented to person, place, and time. CNII-Xii intact Motor  No tremor to outstretched hands  Reflexes 2+ symmetric Skin: Skin is warm and dry.  Psychiatric: She has a normal mood and affect. Her behavior is normal.         Assessment & Plan:  Dizziness resolved:  I counseled pt that she could stay off her antihypertensive for now  Involuntary movement  Index finger   Etiology unclear at this point.  Will check labs today.  I counseled pt that if any movements become more frequent,  Or is associated with visual, speech, numbness or muscle weakness she is to return to see me.  She voices understanding

## 2012-09-11 NOTE — Patient Instructions (Addendum)
Activate my chart  See me as needed

## 2012-09-13 ENCOUNTER — Encounter: Payer: Self-pay | Admitting: *Deleted

## 2012-10-03 ENCOUNTER — Ambulatory Visit: Payer: BC Managed Care – PPO | Admitting: Cardiovascular Disease

## 2012-10-16 ENCOUNTER — Encounter: Payer: Self-pay | Admitting: Cardiovascular Disease

## 2012-11-09 ENCOUNTER — Encounter: Payer: Self-pay | Admitting: Cardiovascular Disease

## 2012-11-09 ENCOUNTER — Ambulatory Visit (INDEPENDENT_AMBULATORY_CARE_PROVIDER_SITE_OTHER): Payer: BC Managed Care – PPO | Admitting: Cardiovascular Disease

## 2012-11-09 VITALS — BP 120/90 | HR 88 | Ht 66.0 in | Wt 136.4 lb

## 2012-11-09 DIAGNOSIS — I10 Essential (primary) hypertension: Secondary | ICD-10-CM

## 2012-11-09 NOTE — Progress Notes (Signed)
Katrina [name] Date of Birth  01-Jan-1961 [facility]     [place]  [address]    [address]   [address] [place], [place]  [address]    [place], [place]  [address] [phone]  Fax  [phone]  [phone]  Fax [phone]  Problem List: 1. Hypetension   History of Present Illness:  Katrina [name] is a 52 yo who presents today for evaluation of HTN.  She was noted to have elevation of her diastolic blood pressure.  She's also been having some dyspnea with exertion. She went hiking at the [name] [name] recently and had significant shortness breath climbing up the trail.  She feels like her metabolism has slowed recently.  She walks her dogs 2 miles a day without problems.   She has been working out and walking regularly.    November 09, 2012:  Katrina [name] was tried on Losartan - she was feeling poorly and her primary recommended that she stop.  She stopped the Losartan and is feeling better.   Current Outpatient Prescriptions on File Prior to Visit  Medication Sig Dispense Refill  . aspirin 81 MG tablet Take 81 mg by mouth daily.        . Multiple Vitamin (MULTIVITAMIN) capsule Take 1 capsule by mouth daily.      . polyethylene glycol (MIRALAX / GLYCOLAX) packet Take 17 g by mouth as needed.      . temazepam (RESTORIL) 15 MG capsule Take 15 mg by mouth at bedtime as needed.        Marland Kitchen losartan (COZAAR) 50 MG tablet Take 1 tablet (50 mg total) by mouth daily.  30 tablet  11   No current facility-administered medications on file prior to visit.    No Known Allergies  Past Medical History  Diagnosis Date  . GERD (gastroesophageal reflux disease)   . Osteopenia   . Raynaud disease     question false positive antiphospholipid panel  . Vitamin D deficiency   . History of abnormal Pap smear     S/P lazer conization  . Menopause   . Mitral valve prolapse     no report of murmur  . Frozen shoulder     history of bilateral frozen shoulder  . Family  history of breast cancer in first degree relative   . Lupus anticoagulant disorder   . Anal fissure   . Diverticulosis   . Hypertension     Past Surgical History  Procedure Laterality Date  . Knee surgery  2006    left  . Foot surgery      left  . Wisdom tooth extraction  1981    History  Smoking status  . Former Smoker -- 1.00 packs/day for 6 years  . Types: Cigarettes  . Quit date: 07/02/1991  Smokeless tobacco  . Never Used    History  Alcohol Use  . 8.4 oz/week  . 14 Glasses of wine per week    Family History  Problem Relation Age of Onset  . Anemia Father   . Heart disease Father     CHF  . Colon cancer Mother     history of   . Breast cancer Mother   . Breast cancer Maternal Aunt   . Cancer Paternal Aunt     brain  . Heart disease Maternal Grandmother   . Cancer Maternal Grandfather     throat  . Stroke Sister     Reviw of Systems:  Reviewed in the HPI.  All other systems are negative.  Physical Exam: Blood pressure 120/90, pulse 88, height 5\' 6"  (1.676 m), weight 136 lb 6.4 oz (61.871 kg), SpO2 95.00%. General: Well developed, well nourished, in no acute distress.  Head: Normocephalic, atraumatic, sclera non-icteric, mucus membranes are moist,   Neck: Supple. Carotids are 2 + without bruits. No JVD  Lungs: Clear bilaterally to auscultation.  Heart: regular rate  has a very soft midsystolic click. There is a soft murmur.  Abdomen: Soft, non-tender, non-distended with normal bowel sounds. No hepatomegaly. No rebound/guarding. No masses.  Msk:  Strength and tone are normal  Extremities: No clubbing or cyanosis. No edema.  Distal pedal pulses are 2+ and equal bilaterally.  Neuro: Alert and oriented X 3. Moves all extremities spontaneously.  Psych:  Responds to questions appropriately with a normal affect.  ECG:  Assessment / Plan:

## 2012-11-09 NOTE — Assessment & Plan Note (Signed)
Katrina [name] is feeling better off the Losartan.  Her diastolic BP is still 90.  She did not tolerate the low dose.  I will see her back in 6 months.  We will try HCTZ ( 25 mg or 12.5 mg) if her diastolic BP remains elevated.

## 2012-11-09 NOTE — Patient Instructions (Addendum)
Your physician wants you to follow-up in: 6 MONTHS  You will receive a reminder letter in the mail two months in advance. If you don't receive a letter, please call our office to schedule the follow-up appointment.  CALL WITH ANY BP QUESTIONS  YOU STOPPED YOUR LOSARTAN ALREADY - IT WAS TAKEN OFF YOUR MED LIST.

## 2013-02-07 ENCOUNTER — Telehealth: Payer: Self-pay | Admitting: *Deleted

## 2013-02-07 NOTE — Telephone Encounter (Signed)
Per Laurel Dimmer receptionist pt's husband called stating that his wife Katrina [name] was having bilat hand pain and bruising offered pt an appt for today but pt declined will schedule appt on ttuesday6/17

## 2013-02-12 ENCOUNTER — Encounter: Payer: Self-pay | Admitting: Internal Medicine

## 2013-02-12 ENCOUNTER — Ambulatory Visit (INDEPENDENT_AMBULATORY_CARE_PROVIDER_SITE_OTHER): Payer: BC Managed Care – PPO | Admitting: Internal Medicine

## 2013-02-12 VITALS — BP 133/78 | HR 71 | Temp 99.5°F | Resp 16 | Ht 66.0 in | Wt 137.0 lb

## 2013-02-12 DIAGNOSIS — R238 Other skin changes: Secondary | ICD-10-CM

## 2013-02-12 DIAGNOSIS — D6859 Other primary thrombophilia: Secondary | ICD-10-CM

## 2013-02-12 DIAGNOSIS — F439 Reaction to severe stress, unspecified: Secondary | ICD-10-CM

## 2013-02-12 DIAGNOSIS — M2550 Pain in unspecified joint: Secondary | ICD-10-CM

## 2013-02-12 DIAGNOSIS — Z733 Stress, not elsewhere classified: Secondary | ICD-10-CM

## 2013-02-12 DIAGNOSIS — D6861 Antiphospholipid syndrome: Secondary | ICD-10-CM

## 2013-02-12 LAB — PROTIME-INR
INR: 0.96 (ref <1.50)
Prothrombin Time: 12.8 seconds (ref 11.6–15.2)

## 2013-02-12 LAB — APTT: aPTT: 33 seconds (ref 24–37)

## 2013-02-12 LAB — SEDIMENTATION RATE: Sed Rate: 1 mm/hr (ref 0–22)

## 2013-02-12 MED ORDER — IBUPROFEN 800 MG PO TABS: ORAL_TABLET | ORAL | Status: DC

## 2013-02-12 NOTE — Progress Notes (Signed)
Subjective:    Patient ID: Katrina Katrina, female    DOB: 18-Sep-1960, 52 y.o.   MRN: [id number]  HPI Katrina Katrina is here for acute visit.  Katrina Katrina has known Katrina Katrina diagnosed by Katrina Katrina but Katrina Katrina is having symptoms in Katrina Katrina hands that are very different from Katrina Katrina Katrina.  Katrina Katrina Katrina easily in both hands,  Joints in Katrina Katrina hands swell for the last two weeks.  Denies white discoloration.  When joints are swollen and Katrina Katrina observes bruising,  Katrina Katrina feels paresthesias in both hands along with Katrina Katrina pain.   Katrina Katrina is not taking any OTC for discomfort.  Upon review of record,   Katrina Katrina had partial coagulopathy work up and was told Katrina Katrina had anti-phospholipid abnormality.  Katrina Katrina was placed on Coumadin at that time . Katrina Katrina was referred to rheumatology who told Katrina Katrina that Katrina Katrina symptoms were due to Katrina and Katrina Katrina came off Coumadin .    Katrina Katrina denies other bruising throughout Katrina Katrina body, no bleeding of Katrina Katrina gums.  Katrina Katrina has never had a hematology eval  Katrina Katrina under lots of stress lately.  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    Tearful at times during interview.  Katrina Katrina did see Katrina Katrina who initiated HT within last two weeks so as to help Katrina Katrina mood    No Known Allergies Past Medical History  Diagnosis Date  . GERD (gastroesophageal reflux disease)   . Osteopenia   . Raynaud disease     question false positive antiphospholipid panel  . Vitamin D deficiency   . History of abnormal Pap smear     S/P lazer conization  . Menopause   . Mitral valve prolapse     no report of murmur  . Frozen shoulder     history of bilateral frozen shoulder  . Family history of breast cancer in first degree relative   . Lupus anticoagulant disorder   . Anal fissure   . Diverticulosis   . Hypertension    Past Surgical History  Procedure Laterality Date  . Knee surgery   2006    left  . Foot surgery      left  . Wisdom tooth extraction  1981   History   Social History  . Marital Status: Katrina Katrina    Spouse Name: N/A    Number of Children: 3  . Years of Education: N/A   Occupational History  . architect Other  .     Social History Main Topics  . Smoking status: Former Smoker -- 1.00 packs/day for 6 years    Types: Cigarettes    Quit date: 07/02/1991  . Smokeless tobacco: Never Used  . Alcohol Use: 8.4 oz/week    14 Glasses of wine per week  . Drug Use: No  . Sexually Active: Yes   Other Topics Concern  . Not on file   Social History Narrative   Katrina Katrina      3 children       Occupation:  Technical sales engineer        Former Smoker       Alcohol use-yes - (1-2 glasses of wine per night)         Family History  Problem Relation Age of Onset  . Anemia Father   . Heart disease Father     CHF  . Colon  cancer Mother     history of   . Breast cancer Mother   . Breast cancer Maternal Aunt   . Cancer Paternal Aunt     brain  . Heart disease Maternal Grandmother   . Cancer Maternal Grandfather     throat  . Stroke Sister    Patient Active Problem List   Diagnosis Date Noted  . Dizziness 09/11/2012  . Constipation 06/04/2012  . Weight gain 06/04/2012  . Anal fissure 06/04/2012  . Symptomatic menopausal or female climacteric states 04/11/2011  . Abnormal Pap smear of cervix 04/11/2011  . Stress incontinence, female 04/11/2011  . Hyperlipidemia 04/11/2011  . Mitral valve prolapse   . Frozen shoulder   . Family history of breast cancer in first degree relative   . Preventative health care 01/10/2011  . VITAMIN D DEFICIENCY 11/18/2008  . Katrina DISEASE 11/18/2008  . INSOMNIA 11/07/2007  . MITRAL VALVE PROLAPSE 05/18/2007  . GERD 05/18/2007  . OSTEOPENIA 05/18/2007   Current Outpatient Prescriptions on File Prior to Visit  Medication Sig Dispense Refill  . aspirin 81 MG tablet Take 81 mg by mouth daily.        . Multiple Vitamin  (MULTIVITAMIN) capsule Take 1 capsule by mouth daily.      . polyethylene glycol (MIRALAX / GLYCOLAX) packet Take 17 g by mouth as needed.      . temazepam (RESTORIL) 15 MG capsule Take 15 mg by mouth at bedtime as needed.         No current facility-administered medications on file prior to visit.       Review of Systems See HPI    Objective:   Physical Exam Physical Exam   Nursing note and vitals reviewed.   Katrina Katrina tearful off and on during interview Constitutional: Katrina Katrina is oriented to person, place, and time. Katrina Katrina appears well-developed and well-nourished.  HENT:  Head: Normocephalic and atraumatic.  Cardiovascular: Normal rate and regular rhythm. Exam reveals no gallop and no friction rub.  No murmur heard.  Pulmonary/Chest: Breath sounds normal. Katrina Katrina has no wheezes. Katrina Katrina has no rales.  Neurological: Katrina Katrina is alert and oriented to person, place, and time.  Skin: Skin is warm and dry. Ext.  Katrina Katrina has good bilateral radial pulses,   No bruising noted today of hands or other extremities .  Sensation intact bilaterally.   Minimal edema of PIP 3rd finger of left hand Psychiatric: Katrina Katrina has a normal mood and affect. Katrina Katrina behavior is normal.             Assessment & Plan:  1)Easy bruising,  Hand arthralgias,  Hand paresthesias  Etiology unclear at this point.  Will do screening labs for possible coagulopathy versus autoimmune etiology to explain symptoms.  Katrina Katrina is to make appt with Katrina Katrina for further rheumatology eval and will also refer Katrina Katrina to hematology .  Will  Check Katrina Katrina/PTT ,  Lupus anticoagulant,  ANA, sed rate to start.   Ok to have motrin 800 mg tid for now  Of note , since I am  unclear if Katrina Katrina has coagulopathy as part of this clinical picture,  I advised Katrina Katrina to come off HT until work up complete.  Katrina Katrina voices understanding.  2)  History of Katrina    3)    Situational stress/depression:  Will follow up at next visit - Katrina Katrina hesitant to start RX treatment now but will readress next  visit.  Katrina Katrina has not SI/HI

## 2013-02-12 NOTE — Patient Instructions (Addendum)
Will refer to hematology  Make appt with Dr. Kellie Simmering    See me in mid July

## 2013-02-13 ENCOUNTER — Other Ambulatory Visit: Payer: Self-pay | Admitting: Obstetrics and Gynecology

## 2013-02-13 ENCOUNTER — Telehealth: Payer: Self-pay | Admitting: Internal Medicine

## 2013-02-13 DIAGNOSIS — R928 Other abnormal and inconclusive findings on diagnostic imaging of breast: Secondary | ICD-10-CM

## 2013-02-13 LAB — LUPUS ANTICOAGULANT PANEL
DRVVT: 31.5 secs (ref <42.9)
Lupus Anticoagulant: NOT DETECTED
PTT Lupus Anticoagulant: 36 secs (ref 28.0–43.0)

## 2013-02-13 LAB — ANA: Anti Nuclear Antibody(ANA): NEGATIVE

## 2013-02-13 NOTE — Telephone Encounter (Signed)
Spoke with pt and informed of lab results  She has upcoming appt with Dr. Kellie Simmering  Will see me in a follow up

## 2013-02-14 ENCOUNTER — Encounter: Payer: Self-pay | Admitting: *Deleted

## 2013-02-15 ENCOUNTER — Telehealth: Payer: Self-pay | Admitting: Oncology

## 2013-02-15 NOTE — Telephone Encounter (Signed)
C/D 02/15/13 for appt. 03/20/13

## 2013-02-15 NOTE — Telephone Encounter (Signed)
Pt scheduled to see Dr. Cyndie Chime 07/23 @ 3.  Referring Dr. Raechel Chute Dx- Antiophoslipid Snydrome Welcome packet mailed.

## 2013-02-19 ENCOUNTER — Other Ambulatory Visit: Payer: Self-pay | Admitting: Oncology

## 2013-02-19 DIAGNOSIS — I7300 Raynaud's syndrome without gangrene: Secondary | ICD-10-CM

## 2013-02-20 ENCOUNTER — Telehealth: Payer: Self-pay | Admitting: Oncology

## 2013-02-25 ENCOUNTER — Telehealth: Payer: Self-pay | Admitting: *Deleted

## 2013-02-25 NOTE — Telephone Encounter (Signed)
Spoke with [place] to let her know about appiontment with Dr Cyndie Chime on 03/20/13 at 3:00 pm.  She also wanted to let us know that she set an appointment up with a Rheumatologist Dr Kellie Simmering on 7/15.  She wanted to make sure that it was ok to see him before Dr Cyndie Chime.

## 2013-02-26 ENCOUNTER — Ambulatory Visit
Admission: RE | Admit: 2013-02-26 | Discharge: 2013-02-26 | Disposition: A | Discharge status: Home / Self Care | Payer: BC Managed Care – PPO | Source: Ambulatory Visit | Attending: Obstetrics and Gynecology | Admitting: Obstetrics and Gynecology

## 2013-02-26 DIAGNOSIS — R928 Other abnormal and inconclusive findings on diagnostic imaging of breast: Secondary | ICD-10-CM

## 2013-02-28 ENCOUNTER — Telehealth: Payer: Self-pay | Admitting: *Deleted

## 2013-02-28 NOTE — Telephone Encounter (Signed)
LVM message regarding MM results and appt

## 2013-02-28 NOTE — Telephone Encounter (Signed)
Message copied by Mathews Robinsons on Thu Feb 28, 2013 12:10 PM ------      Message from: Raechel Chute D      Created: Thu Feb 28, 2013 10:47 AM       [name]            Call Lee and let her know I have seen her breast mammogram and ultrasound result.  Ultrasound shows no abnormality .  Give her a 30 min appt to see me for follow up at her convenience  No hurry ------

## 2013-03-04 ENCOUNTER — Encounter: Payer: Self-pay | Admitting: *Deleted

## 2013-03-20 ENCOUNTER — Encounter: Payer: Self-pay | Admitting: Oncology

## 2013-03-20 ENCOUNTER — Other Ambulatory Visit (HOSPITAL_BASED_OUTPATIENT_CLINIC_OR_DEPARTMENT_OTHER): Payer: BC Managed Care – PPO | Admitting: Lab

## 2013-03-20 ENCOUNTER — Ambulatory Visit (HOSPITAL_BASED_OUTPATIENT_CLINIC_OR_DEPARTMENT_OTHER): Payer: BC Managed Care – PPO

## 2013-03-20 ENCOUNTER — Telehealth: Payer: Self-pay

## 2013-03-20 ENCOUNTER — Ambulatory Visit (HOSPITAL_BASED_OUTPATIENT_CLINIC_OR_DEPARTMENT_OTHER): Payer: BC Managed Care – PPO | Admitting: Oncology

## 2013-03-20 VITALS — BP 133/85 | HR 81 | Temp 98.6°F | Resp 18 | Ht 66.0 in | Wt 139.2 lb

## 2013-03-20 DIAGNOSIS — I7300 Raynaud's syndrome without gangrene: Secondary | ICD-10-CM

## 2013-03-20 DIAGNOSIS — D6859 Other primary thrombophilia: Secondary | ICD-10-CM

## 2013-03-20 LAB — CBC & DIFF AND RETIC
BASO%: 0.4 % (ref 0.0–2.0)
Basophils Absolute: 0 10*3/uL (ref 0.0–0.1)
EOS%: 5.3 % (ref 0.0–7.0)
Eosinophils Absolute: 0.3 10*3/uL (ref 0.0–0.5)
HCT: 39.1 % (ref 34.8–46.6)
HGB: 12.7 g/dL (ref 11.6–15.9)
Immature Retic Fract: 2 % (ref 1.60–10.00)
LYMPH%: 38.3 % (ref 14.0–49.7)
MCH: 32.3 pg (ref 25.1–34.0)
MCHC: 32.5 g/dL (ref 31.5–36.0)
MCV: 99.5 fL (ref 79.5–101.0)
MONO#: 0.5 10*3/uL (ref 0.1–0.9)
MONO%: 8 % (ref 0.0–14.0)
NEUT#: 2.7 10*3/uL (ref 1.5–6.5)
NEUT%: 48 % (ref 38.4–76.8)
Platelets: 226 10*3/uL (ref 145–400)
RBC: 3.93 10*6/uL (ref 3.70–5.45)
RDW: 12.1 % (ref 11.2–14.5)
Retic %: 0.88 % (ref 0.70–2.10)
Retic Ct Abs: 34.58 10*3/uL (ref 33.70–90.70)
WBC: 5.6 10*3/uL (ref 3.9–10.3)
lymph#: 2.2 10*3/uL (ref 0.9–3.3)

## 2013-03-20 LAB — MORPHOLOGY: PLT EST: ADEQUATE

## 2013-03-20 LAB — CHCC SMEAR

## 2013-03-20 NOTE — Progress Notes (Signed)
New Patient Hematology-Oncology Evaluation   Katrina [name] [id number] [birth year] 52 y.o. 03/20/2013  CC: Dr. Raechel Chute; Dr. Stacey Drain; Dr. 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 About 5 years ago she developed what has been called Raynaud's phenomenon with intermittent cyanosis of her fingers primarily the middle fingers. 3 years ago the middle finger of her right hand turned blue. This was transient. She was put on Coumadin anticoagulation at some point. She feels that the cyanotic changes are worse during times of stress. Cold weather does not as appear to affect her fingers.  6 weeks ago the middle finger of her left hand turned blue with extension of what was apparently a hematoma down to the base of the finger. She has had a rheumatologic evaluation but this data is not available at time of this dictation. She had x-rays of her hands which did not show changes typical of inflammatory arthritis. She has had some joint stiffness and swelling of her MIP joints. She gets intermittent stiffness of the muscles of her neck and shoulders. No myalgias. No large joint symptoms. No atypical skin rashes. No excessive hair loss. She has  easy bruisability. She has no personal or family history of blood clots. A paternal grandmother had rheumatoid arthritis. A 50 year old brother recently treated for thyroid cancer. 2 other brothers and a sister are healthy. A 97 year old daughter has Crohn's colitis. 2 other daughters are healthy.  A number of laboratory tests were done in anticipation of this visit. On 02/12/2013:  protime 12.8 seconds, PTT 33 seconds, ESR 1 mm, ANA negative, negative lupus-type anticoagulant. Lupus anticoagulant also tested negative in June 2010. Normal thyroid functions. Her hemoglobin is 13 g and has been stable at this level for at least 5 years  since 11/01/2007. She has a normal platelet count. Normal reticulocyte count. Normal white count and differential.   PMH: Past Medical History  Diagnosis Date  . GERD (gastroesophageal reflux disease)   . Osteopenia   . Raynaud disease     question false positive antiphospholipid panel  . Vitamin D deficiency   . History of abnormal Pap smear     S/P lazer conization  . Menopause   . Mitral valve prolapse     no report of murmur  . Frozen shoulder     history of bilateral frozen shoulder  . Family history of breast cancer in first degree relative   . Lupus anticoagulant disorder   . Anal fissure   . Diverticulosis   . Hypertension   Atrial septal defect. No history of hepatitis, yellow jaundice, malaria, tuberculosis, seizure, stroke, thyroid disease.  Past Surgical History  Procedure Laterality Date  . Knee surgery  2006    left  . Foot surgery      left  . Wisdom tooth extraction  1981    Allergies: No Known Allergies  Medications:    Social History: See history of present illness.  reports that she quit smoking about 21 years ago. Her smoking use included Cigarettes. She has a 6 pack-year smoking history. She has never used smokeless tobacco. She reports that she drinks about 8.4 ounces of alcohol per week. She reports that she does not use illicit drugs.  Family History: See history of present illness. Family History  Problem Relation Age of Onset  . Anemia Father   . Heart disease Father  CHF  . Colon cancer Mother     history of   . Breast cancer Mother   . Breast cancer Maternal Aunt   . Cancer Paternal Aunt     brain  . Heart disease Maternal Grandmother   . Cancer Maternal Grandfather     throat  . Stroke Sister     Review of Systems: Constitutional symptoms: No constitutional symptoms HEENT: No sore throat Respiratory: No cough or dyspnea Cardiovascular:  No chest pain. Occasional palpitations. Mostly positional. (If she bends down and  gets up quickly). Gastrointestinal ROS: Intermittent reflux symptoms no hematochezia or melena Genito-Urinary ROS: No urinary tract symptoms Hematological and Lymphatic: Musculoskeletal: See above Neurologic: No headache or change in vision Dermatologic: See above Remaining ROS negative.  Physical Exam: Blood pressure 133/85, pulse 81, temperature 98.6 F (37 C), temperature source Oral, resp. rate 18, height 5\' 6"  (1.676 m), weight 139 lb 3.2 oz (63.141 kg). Wt Readings from Last 3 Encounters:  03/20/13 139 lb 3.2 oz (63.141 kg)  02/12/13 137 lb (62.143 kg)  11/09/12 136 lb 6.4 oz (61.871 kg)    General appearance: Tall, well-nourished, Caucasian woman HENNT: Pharynx no erythema or exudate Lymph nodes: No cervical, supraclavicular, or axillary adenopathy Breasts: Lungs: Clear to auscultation resonant to percussion Heart: Regular rhythm, no murmur, I did not appreciate a click Vascular: Carotids 2+ no bruits, brachial pulses 2+ symmetric, radial pulses 2+ symmetric, ulnar pulses not palpable but symmetric, dorsalis pedis pulses 2+ symmetric, posterior tibial pulses 1+ symmetric. Fingers are slightly dusky in color. She is wearing nail polish. No gross cyanosis. Abdominal: Soft, nontender, no mass, no organomegaly GU: Extremities: No edema, no calf tenderness Neurologic: Mental status intact, PERRLA, cranial nerves grossly normal, motor strength 5 over 5, reflexes 1+ symmetric, sensation intact to vibration over the fingertips Skin: No rash or ecchymosis    Lab Results: Lab Results  Component Value Date   WBC 5.6 03/20/2013   HGB 12.7 03/20/2013   HCT 39.1 03/20/2013   MCV 99.5 03/20/2013   PLT 226 03/20/2013     Chemistry      Component Value Date/Time   NA 139 09/11/2012 1006   K 4.4 09/11/2012 1006   CL 103 09/11/2012 1006   CO2 28 09/11/2012 1006   BUN 11 09/11/2012 1006   CREATININE 0.78 09/11/2012 1006   CREATININE 0.9 07/30/2012 1218      Component Value Date/Time    CALCIUM 9.8 09/11/2012 1006   ALKPHOS 42 09/11/2012 1006   AST 19 09/11/2012 1006   ALT 16 09/11/2012 1006   BILITOT 0.6 09/11/2012 1006       Review of peripheral blood film: Normochromic normocytic red cells. Mature lymphocytes and neutrophils. No rouleaux. Normal platelets.    Impression and Plan: Isolated Raynaud's phenomenon  There is no clinical or lab data that supports a diagnosis of antiphospholipid antibody syndrome. I'm going to screen her for cryoglobulinemia. I am going to check anticardiolipin antibodies antibodies to beta 2 glycoprotein 1  I will also check a serum protein electrophoresis with immunofixation electrophoresis to rule out monoclonal gammopathy. I will call her when results are available.  I suggested that she see a vascular surgeon to have an evaluation of her digital arteries. I suggested that she consider either going on a calcium channel blocker or Trental for symptomatic relief of her Reynaud's but to wait until further advice from her rheumatologist and/or vascular surgeon.      Levert Feinstein, MD 03/20/2013, 7:23 PM

## 2013-03-20 NOTE — Progress Notes (Signed)
Checked in new patient. No financial issues. Didn't ask if living will/POA. email address is correct.

## 2013-03-26 LAB — CRYOGLOBULIN

## 2013-03-26 LAB — IMMUNOFIXATION ELECTROPHORESIS
IgA: 173 mg/dL (ref 69–380)
IgG (Immunoglobin G), Serum: 926 mg/dL (ref 690–1700)
IgM, Serum: 49 mg/dL — ABNORMAL LOW (ref 52–322)
Total Protein, Serum Electrophoresis: 6.8 g/dL (ref 6.0–8.3)

## 2013-03-26 LAB — CARDIOLIPIN ANTIBODIES, IGG, IGM, IGA
Anticardiolipin IgA: 0 APL U/mL (ref <22)
Anticardiolipin IgG: 6 GPL U/mL (ref <23)
Anticardiolipin IgM: 0 MPL U/mL (ref <11)

## 2013-03-26 LAB — BETA-2 GLYCOPROTEIN ANTIBODIES
Beta-2 Glyco I IgG: 6 G Units (ref <20)
Beta-2-Glycoprotein I IgA: 0 A Units (ref <20)
Beta-2-Glycoprotein I IgM: 1 M Units (ref <20)

## 2013-04-08 ENCOUNTER — Telehealth: Payer: Self-pay | Admitting: *Deleted

## 2013-04-08 NOTE — Telephone Encounter (Signed)
See [name]'s note 

## 2013-04-08 NOTE — Telephone Encounter (Signed)
Fara Boros called today wanting Dr Reece Levy to refer her to a counselor or therapist.  She would like Dr Reece Levy to email her or text her.

## 2013-04-08 NOTE — Telephone Encounter (Signed)
Sent name of two counselors

## 2013-04-28 ENCOUNTER — Emergency Department (HOSPITAL_COMMUNITY)
Admission: EM | Admit: 2013-04-28 | Discharge: 2013-04-28 | Disposition: A | Discharge status: Home / Self Care | Payer: BC Managed Care – PPO | Attending: Emergency Medicine | Admitting: Emergency Medicine

## 2013-04-28 ENCOUNTER — Encounter (HOSPITAL_COMMUNITY): Payer: Self-pay | Admitting: Emergency Medicine

## 2013-04-28 DIAGNOSIS — K921 Melena: Secondary | ICD-10-CM | POA: Insufficient documentation

## 2013-04-28 DIAGNOSIS — Z8739 Personal history of other diseases of the musculoskeletal system and connective tissue: Secondary | ICD-10-CM | POA: Insufficient documentation

## 2013-04-28 DIAGNOSIS — Z862 Personal history of diseases of the blood and blood-forming organs and certain disorders involving the immune mechanism: Secondary | ICD-10-CM | POA: Insufficient documentation

## 2013-04-28 DIAGNOSIS — R197 Diarrhea, unspecified: Secondary | ICD-10-CM

## 2013-04-28 DIAGNOSIS — Z87891 Personal history of nicotine dependence: Secondary | ICD-10-CM | POA: Insufficient documentation

## 2013-04-28 DIAGNOSIS — I10 Essential (primary) hypertension: Secondary | ICD-10-CM | POA: Insufficient documentation

## 2013-04-28 DIAGNOSIS — Z7982 Long term (current) use of aspirin: Secondary | ICD-10-CM | POA: Insufficient documentation

## 2013-04-28 DIAGNOSIS — K625 Hemorrhage of anus and rectum: Secondary | ICD-10-CM | POA: Insufficient documentation

## 2013-04-28 DIAGNOSIS — Z8742 Personal history of other diseases of the female genital tract: Secondary | ICD-10-CM | POA: Insufficient documentation

## 2013-04-28 LAB — CBC WITH DIFFERENTIAL/PLATELET
Basophils Absolute: 0 10*3/uL (ref 0.0–0.1)
Basophils Relative: 1 % (ref 0–1)
Eosinophils Absolute: 0.3 10*3/uL (ref 0.0–0.7)
Eosinophils Relative: 5 % (ref 0–5)
HCT: 40.2 % (ref 36.0–46.0)
Hemoglobin: 13.5 g/dL (ref 12.0–15.0)
Lymphocytes Relative: 31 % (ref 12–46)
Lymphs Abs: 1.7 10*3/uL (ref 0.7–4.0)
MCH: 32.6 pg (ref 26.0–34.0)
MCHC: 33.6 g/dL (ref 30.0–36.0)
MCV: 97.1 fL (ref 78.0–100.0)
Monocytes Absolute: 0.8 10*3/uL (ref 0.1–1.0)
Monocytes Relative: 14 % — ABNORMAL HIGH (ref 3–12)
Neutro Abs: 2.7 10*3/uL (ref 1.7–7.7)
Neutrophils Relative %: 49 % (ref 43–77)
Platelets: 260 10*3/uL (ref 150–400)
RBC: 4.14 MIL/uL (ref 3.87–5.11)
RDW: 11.7 % (ref 11.5–15.5)
WBC: 5.5 10*3/uL (ref 4.0–10.5)

## 2013-04-28 LAB — BASIC METABOLIC PANEL
BUN: 7 mg/dL (ref 6–23)
CO2: 26 mEq/L (ref 19–32)
Calcium: 9.6 mg/dL (ref 8.4–10.5)
Chloride: 96 mEq/L (ref 96–112)
Creatinine, Ser: 0.81 mg/dL (ref 0.50–1.10)
GFR calc Af Amer: >90 mL/min (ref >90)
GFR calc non Af Amer: 81 mL/min — ABNORMAL LOW (ref >90)
Glucose, Bld: 106 mg/dL — ABNORMAL HIGH (ref 70–99)
Potassium: 3.7 mEq/L (ref 3.5–5.1)
Sodium: 132 mEq/L — ABNORMAL LOW (ref 135–145)

## 2013-04-28 LAB — OCCULT BLOOD, POC DEVICE: Fecal Occult Bld: NEGATIVE

## 2013-04-28 MED ORDER — SODIUM CHLORIDE 0.9 % IV SOLN
INTRAVENOUS | Status: DC
  Administered 2013-04-28: 16:00:00 via INTRAVENOUS

## 2013-04-28 NOTE — ED Notes (Signed)
Pt reports being treated for a UTI with antibiotics that was prescribed by the patient's PCP. Pt states that diarrhea began on Wednesday and has a lot of mucus, a foul odor, and stomach cramping. Pt also states that she has seen blood, which she believes to be attributed to hemmorroids. Pt called PCP and they instructed her to stop antibiotic and the diarrhea should stop. Pt called back to PCP after the diarrhea did not stop and was referred to ED to be evaluated for C-diff.

## 2013-04-28 NOTE — ED Provider Notes (Signed)
CSN: [id number]     Arrival date & time 04/28/13  1442 History   First MD Initiated Contact with Patient 04/28/13 1501     Chief Complaint  Patient presents with  . Diarrhea  . Rectal Bleeding   (Consider location/radiation/quality/duration/timing/severity/associated sxs/prior Treatment) HPI Comments: Katrina [name] is a 52 y.o. female presents for evaluation Of diarrhea, present for 3 days. She was on antibiotics for one week prior to this as treatment for a urinary tract infection. She had 10 stools today. She denies nausea or vomiting. She denies abdominal pain except when having a bowel movement that she has a cramp. She denies dysuria, urinary frequency, fever, chills, cough, shortness of breath, or chest pain. She has stopped the antibiotics. There no other known modifying factors  Patient is a 52 y.o. female presenting with diarrhea and hematochezia. The history is provided by the patient.  Diarrhea Rectal Bleeding   Past Medical History  Diagnosis Date  . GERD (gastroesophageal reflux disease)   . Osteopenia   . Raynaud disease     question false positive antiphospholipid panel  . Vitamin D deficiency   . History of abnormal Pap smear     S/P lazer conization  . Menopause   . Mitral valve prolapse     no report of murmur  . Frozen shoulder     history of bilateral frozen shoulder  . Family history of breast cancer in first degree relative   . Lupus anticoagulant disorder   . Anal fissure   . Diverticulosis   . Hypertension    Past Surgical History  Procedure Laterality Date  . Knee surgery  2006    left  . Foot surgery      left  . Wisdom tooth extraction  1981   Family History  Problem Relation Age of Onset  . Anemia Father   . Heart disease Father     CHF  . Colon cancer Mother     history of   . Breast cancer Mother   . Breast cancer Maternal Aunt   . Cancer Paternal Aunt     brain  . Heart disease Maternal Grandmother   . Cancer Maternal  Grandfather     throat  . Stroke Sister    History  Substance Use Topics  . Smoking status: Former Smoker -- 1.00 packs/day for 6 years    Types: Cigarettes    Quit date: 07/02/1991  . Smokeless tobacco: Never Used  . Alcohol Use: 8.4 oz/week    14 Glasses of wine per week   OB History   Grav Para Term Preterm Abortions TAB SAB Ect Mult Living                 Review of Systems  Gastrointestinal: Positive for diarrhea and hematochezia.  All other systems reviewed and are negative.    Allergies  Cefuroxime  Home Medications   Current Outpatient Rx  Name  Route  Sig  Dispense  Refill  . aspirin EC 81 MG tablet   Oral   Take 81 mg by mouth 2 (two) times a week.         Marland Kitchen ibuprofen (ADVIL,MOTRIN) 200 MG tablet   Oral   Take 200 mg by mouth every 8 (eight) hours as needed for pain.         Marland Kitchen zolpidem (AMBIEN) 10 MG tablet   Oral   Take 5 mg by mouth at bedtime as needed for sleep.  BP 114/71  Pulse 75  Temp(Src) 98.5 F (36.9 C) (Oral)  Resp 16  SpO2 96% Physical Exam  Nursing note and vitals reviewed. Constitutional: She is oriented to person, place, and time. She appears well-developed and well-nourished.  HENT:  Head: Normocephalic and atraumatic.  Eyes: Conjunctivae and EOM are normal. Pupils are equal, round, and reactive to light.  Neck: Normal range of motion and phonation normal. Neck supple.  Cardiovascular: Normal rate, regular rhythm and intact distal pulses.   Pulmonary/Chest: Effort normal and breath sounds normal. She exhibits no tenderness.  Abdominal: Soft. She exhibits no distension. There is no tenderness. There is no guarding.  Musculoskeletal: Normal range of motion.  Neurological: She is alert and oriented to person, place, and time. She has normal strength. She exhibits normal muscle tone.  Skin: Skin is warm and dry.  Psychiatric: She has a normal mood and affect. Her behavior is normal. Judgment and thought content  normal.    ED Course  Procedures (including critical care time)  She was treated in ED with IV fluids, and oral intake. She was unable to produce stool.  She was evaluated and discharged, and is comfortable. She is given a stool kit to obtain stool home and return the stool sample to the lab for testing  Labs Review Labs Reviewed  CBC WITH DIFFERENTIAL - Abnormal; Notable for the following:    Monocytes Relative 14 (*)    All other components within normal limits  BASIC METABOLIC PANEL - Abnormal; Notable for the following:    Sodium 132 (*)    Glucose, Bld 106 (*)    GFR calc non Af Amer 81 (*)    All other components within normal limits  STOOL CULTURE  CLOSTRIDIUM DIFFICILE BY PCR  OCCULT BLOOD, POC DEVICE   Imaging Review No results found.  MDM   1. Diarrhea    Diarrhea post antibiotics. Doubt sepsis, significant volume loss or systemic illness. She is stable for discharge and outpatient management   Nursing Notes Reviewed/ Care Coordinated, and agree without changes. Applicable Imaging Reviewed.  Interpretation of Laboratory Data incorporated into ED treatment    Plan: Home Medications- usual; Home Treatments and Observation- rest, fluid, watch for progressive symptoms; return here if the recommended treatment, does not improve the symptoms; Recommended follow up- PCP, as needed      Flint Melter, MD 04/28/13 2330

## 2013-04-29 ENCOUNTER — Telehealth (HOSPITAL_COMMUNITY): Payer: Self-pay | Admitting: Emergency Medicine

## 2013-04-29 ENCOUNTER — Encounter: Payer: Self-pay | Admitting: Gastroenterology

## 2013-04-29 LAB — CLOSTRIDIUM DIFFICILE BY PCR: Toxigenic C. Difficile by PCR: POSITIVE — AB

## 2013-04-30 ENCOUNTER — Telehealth: Payer: Self-pay

## 2013-04-30 ENCOUNTER — Telehealth: Payer: Self-pay | Admitting: Internal Medicine

## 2013-04-30 ENCOUNTER — Telehealth: Payer: Self-pay | Admitting: Gastroenterology

## 2013-04-30 NOTE — Telephone Encounter (Signed)
Message copied by Annett Fabian on Tue Apr 30, 2013  2:27 PM ------      Message from: Claudette Head T      Created: Tue Apr 30, 2013  8:35 AM      Regarding: FW: C. Difficle positive on 04/29/13                   ----- Message -----         From: Charna Elizabeth, MD         Sent: 04/29/2013   2:08 PM           To: Meryl Dare, MD, Kendrick Ranch, MD      Subject: C. Difficle positive on 04/29/13                         Patient called me yesterday from her vacation home in [facility] telling me she had severe diarrhea for 4 days with 10-12 BM's per day. She was recently treated for a UTI with Cefuroxime. I advised her to go to the ER and get checked for C. Difficle colitis. She tested positive for it and therefore I called in Flagyl 250 mg TID PO for her to [facility] on [address]. I have prescribed 30 days worth of medication with no refilLs. She has been advised to take the pills for 15 days along with a probiotic-UltraFlora IB. She has also been advised to see Dr. Russella Dar in follow up, within the next 7-10 days.    ------

## 2013-04-30 NOTE — ED Notes (Addendum)
Chart returned from EDP office .Please notify patient of results. Recommend flagyl 500 mg po TID x 10 days . F/U with your doctor for recheck in 1 week. If having persist ant diarrhea then return to ER for further care per Fayrene Helper

## 2013-04-30 NOTE — Telephone Encounter (Signed)
Spoke with pt.    Stool more formed,  Less in volume   WBC normal at [facility]  She is taking her flagyl  She has folllow up appt with Dr. Russella Dar

## 2013-04-30 NOTE — Telephone Encounter (Signed)
Patient advised of the appt for 05/08/13 10:15.  All questions answered.  She has had some improvement in her stools.  She is advised to continue Flaygl as ordered and keep the appt for next week.  She is asked to call back for additional questions or concerns

## 2013-04-30 NOTE — Telephone Encounter (Signed)
See phone note from 04/30/13 with details

## 2013-05-03 LAB — STOOL CULTURE

## 2013-05-08 ENCOUNTER — Ambulatory Visit (INDEPENDENT_AMBULATORY_CARE_PROVIDER_SITE_OTHER): Payer: BC Managed Care – PPO | Admitting: Gastroenterology

## 2013-05-08 ENCOUNTER — Encounter: Payer: Self-pay | Admitting: Gastroenterology

## 2013-05-08 VITALS — BP 100/60 | HR 80 | Ht 66.0 in | Wt 131.0 lb

## 2013-05-08 DIAGNOSIS — A0472 Enterocolitis due to Clostridium difficile, not specified as recurrent: Secondary | ICD-10-CM

## 2013-05-08 DIAGNOSIS — Z800 Family history of malignant neoplasm of digestive organs: Secondary | ICD-10-CM

## 2013-05-08 NOTE — Patient Instructions (Addendum)
Complete your therapy of metronidazole.   Stay on probiotic 1-3 months after finishing your antibiotic.   You will be due for a recall colonoscopy in 08/2013. We will send you a reminder in the mail when it gets closer to that time.  Thank you for choosing me and [facility].  Venita Lick. Pleas Koch., MD., Clementeen Graham

## 2013-05-08 NOTE — Progress Notes (Signed)
History of Present Illness: This is a 52 year old female who developed severe diarrhea following a course of cefuroxime. She was having 10-12 bowel movements a day with cramping mucous and small amounts of bleeding. She was evaluated in the emergency department and C. difficile  was positive. Hemoccult testing was negative. All other stool studies were negative. She was placed on a probiotic and metronidazole and her symptoms resolved over the course of 5-6 days. She's currently asymptomatic.  Current Medications, Allergies, Past Medical History, Past Surgical History, Family History and Social History were reviewed in [employer] record.  Physical Exam: General: Well developed , well nourished, no acute distress Head: Normocephalic and atraumatic Eyes:  sclerae anicteric, EOMI Ears: Normal auditory acuity Mouth: No deformity or lesions Lungs: Clear throughout to auscultation Heart: Regular rate and rhythm; no murmurs, rubs or bruits Abdomen: Soft, non tender and non distended. No masses, hepatosplenomegaly or hernias noted. Normal Bowel sounds Musculoskeletal: Symmetrical with no gross deformities  Pulses:  Normal pulses noted Extremities: No clubbing, cyanosis, edema or deformities noted Neurological: Alert oriented x 4, grossly nonfocal Psychological:  Alert and cooperative. Normal mood and affect  Assessment and Recommendations:  1. C diff colitis. Complete entire course of metronidazole as prescribed. Continue probiotics for 1-3 months following completion of metronidazole. She is advised to contact us if her diarrhea or bleeding returns.  2. Family history of colon cancer, mother. Colonoscopy recall due in 08/2013.

## 2013-05-10 ENCOUNTER — Telehealth: Payer: Self-pay | Admitting: Gastroenterology

## 2013-05-10 NOTE — Telephone Encounter (Signed)
Patient was instructed to take complete the rx of metronidazole that Dr. Loreta Ave sent in, but the patient states that she wrote the rx for 30 days.  She was told verbally by Dr. Loreta Ave to take 250 mg tid for 15 days and a probiotic for 30 days.  Can she stop metronidazole after 15 days?

## 2013-05-12 NOTE — Telephone Encounter (Signed)
Yes metronidazole for 15 days and then DC.

## 2013-05-13 NOTE — Telephone Encounter (Signed)
Left message for patient to call back  

## 2013-05-14 NOTE — Telephone Encounter (Signed)
I left a message for her with Dr. Ardell Isaacs response.  I have asked that she call back for any additional questions or concerns

## 2013-07-04 ENCOUNTER — Other Ambulatory Visit: Payer: Self-pay

## 2013-08-09 ENCOUNTER — Encounter: Payer: Self-pay | Admitting: Gastroenterology

## 2013-10-28 ENCOUNTER — Encounter: Payer: Self-pay | Admitting: Oncology

## 2013-11-06 ENCOUNTER — Encounter: Payer: Self-pay | Admitting: Oncology

## 2013-12-24 ENCOUNTER — Ambulatory Visit (INDEPENDENT_AMBULATORY_CARE_PROVIDER_SITE_OTHER): Payer: BC Managed Care – PPO | Admitting: Internal Medicine

## 2013-12-24 ENCOUNTER — Encounter: Payer: Self-pay | Admitting: Internal Medicine

## 2013-12-24 VITALS — BP 138/86 | HR 75 | Temp 98.1°F | Resp 18 | Wt 136.0 lb

## 2013-12-24 DIAGNOSIS — G43909 Migraine, unspecified, not intractable, without status migrainosus: Secondary | ICD-10-CM

## 2013-12-24 DIAGNOSIS — R51 Headache: Secondary | ICD-10-CM

## 2013-12-24 MED ORDER — RIZATRIPTAN BENZOATE 5 MG PO TBDP: ORAL_TABLET | ORAL | Status: DC

## 2013-12-24 NOTE — Progress Notes (Signed)
Subjective:    Patient ID: Katrina [name], female    DOB: 06/15/1961, 53 y.o.   MRN: [id number]  HPI  Katrina [name] is here for acute visit  She recently was told by her optometrist that she has ocular migraines.  She began with visual change where she saw bright lights in part of visual field.  No blurring or double vision.  Was told she was experiencing ocular migraine - I have no notes  She has had recent headaches temporal and frontal with nausea bu she thought these were sinus headaches.  Headaches occur infrequently but she has photosensitiviy and severe nausea in the past  - had to sleep off headache in dark room.   She does recall in 2008 she had some facial numbness - her primarY MD wanted to get MRI but pt is claustrophobic.  She denies dyarthria,  No motor weakness.    FH of migraine in sister  And sister has "basilar epilepsy"  Father also had severe headaches.  NO FH of aneurysm   Allergies  Allergen Reactions  . Cefuroxime Diarrhea and Nausea And Vomiting   Past Medical History  Diagnosis Date  . GERD (gastroesophageal reflux disease)   . Osteopenia   . Raynaud disease     question false positive antiphospholipid panel  . Vitamin D deficiency   . History of abnormal Pap smear     S/P lazer conization  . Menopause   . Mitral valve prolapse     no report of murmur  . Frozen shoulder     history of bilateral frozen shoulder  . Family history of breast cancer in first degree relative   . Lupus anticoagulant disorder   . Anal fissure   . Diverticulosis   . Hypertension   . C. difficile diarrhea    Past Surgical History  Procedure Laterality Date  . Knee surgery  2006    left  . Foot surgery      left  . Wisdom tooth extraction  1981   History   Social History  . Marital Status: Married    Spouse Name: N/A    Number of Children: 3  . Years of Education: N/A   Occupational History  . architect Other  .     Social History Main Topics  . Smoking  status: Former Smoker -- 1.00 packs/day for 6 years    Types: Cigarettes    Quit date: 07/02/1991  . Smokeless tobacco: Never Used  . Alcohol Use: 8.4 oz/week    14 Glasses of wine per week  . Drug Use: No  . Sexual Activity: Yes   Other Topics Concern  . Not on file   Social History Narrative   Married      3 children       Occupation:  Arboriculturist        Former Smoker       Alcohol use-yes - (1-2 glasses of wine per night)         Family History  Problem Relation Age of Onset  . Anemia Father   . Heart disease Father     CHF  . Lung disease Father   . Colon cancer Mother     history of   . Breast cancer Mother   . Breast cancer Maternal Aunt   . Cancer Paternal Aunt     brain  . Heart disease Maternal Grandmother   . Cancer Maternal Grandfather     throat  .  Stroke Sister    Patient Active Problem List   Diagnosis Date Noted  . Dizziness 09/11/2012  . Constipation 06/04/2012  . Weight gain 06/04/2012  . Anal fissure 06/04/2012  . Symptomatic menopausal or female climacteric states 04/11/2011  . Abnormal Pap smear of cervix 04/11/2011  . Stress incontinence, female 04/11/2011  . Hyperlipidemia 04/11/2011  . Mitral valve prolapse   . Frozen shoulder   . Family history of breast cancer in first degree relative   . Preventative health care 01/10/2011  . VITAMIN D DEFICIENCY 11/18/2008  . RAYNAUD'S DISEASE 11/18/2008  . INSOMNIA 11/07/2007  . MITRAL VALVE PROLAPSE 05/18/2007  . GERD 05/18/2007  . OSTEOPENIA 05/18/2007   Current Outpatient Prescriptions on File Prior to Visit  Medication Sig Dispense Refill  . ibuprofen (ADVIL,MOTRIN) 200 MG tablet Take 200 mg by mouth every 8 (eight) hours as needed for pain.      . Probiotic Product (PROBIOTIC DAILY PO) Take by mouth.      Marland Kitchen aspirin EC 81 MG tablet Take 81 mg by mouth 2 (two) times a week.      . zolpidem (AMBIEN) 10 MG tablet Take 5 mg by mouth at bedtime as needed for sleep.        No current  facility-administered medications on file prior to visit.       Review of Systems See HPI    Objective:   Physical Exam Physical Exam  Nursing note and vitals reviewed.  Constitutional: She is oriented to person, place, and time. She appears well-developed and well-nourished.  HENT:  Head: Normocephalic and atraumatic.  Cardiovascular: Normal rate and regular rhythm. Exam reveals no gallop and no friction rub.  No murmur heard.  Pulmonary/Chest: Breath sounds normal. She has no wheezes. She has no rales.  Neurological: She is alert and oriented to person, place, and time.  CNII_XII intact Motor 5/5 UE and LE no pronator drift Cerebellar intact FTN Sensory intact and symmetric to microfilament testing Reflexes 2+ symmetric Romberg neg  Skin: Skin is warm and dry.  Psychiatric: She has a normal mood and affect. Her behavior is normal.        Assessment & Plan:  Headache:  Clinically consistant with migraine.  Going back through history she may have complicated migraine and was afraid of imaging due to claustrophobia.    Will get neurology opinion regarding best testing as she may need MRI/MRA  .  Pt voices understanding  Migraine:   Will start Maxalt mlt 5 mg at onset of visual change or headache pain.   Pt to call me if any worsening symptoms, dysarthria , or motor weakness or numbness

## 2013-12-24 NOTE — Patient Instructions (Signed)
Set up referral to Dr. Loretta Plume  After may 25th

## 2014-01-16 ENCOUNTER — Encounter: Payer: Self-pay | Admitting: *Deleted

## 2014-01-22 ENCOUNTER — Encounter: Payer: Self-pay | Admitting: Neurology

## 2014-01-22 ENCOUNTER — Ambulatory Visit (INDEPENDENT_AMBULATORY_CARE_PROVIDER_SITE_OTHER): Payer: BC Managed Care – PPO | Admitting: Neurology

## 2014-01-22 VITALS — BP 130/82 | HR 74 | Temp 98.5°F | Resp 16 | Ht 66.0 in | Wt 136.7 lb

## 2014-01-22 DIAGNOSIS — G43109 Migraine with aura, not intractable, without status migrainosus: Secondary | ICD-10-CM

## 2014-01-22 NOTE — Patient Instructions (Signed)
I agree that you have optic migraine.  Since you had it for so many years, I don't think we need to pursue imaging of the brain (such as MRI).  No follow up necessary unless there is a problem or if you experience any new symptoms not typical of these episodes.

## 2014-01-22 NOTE — Progress Notes (Signed)
NEUROLOGY CONSULTATION NOTE  BRINLEIGH TEW MRN: [id number] DOB: August 05, 1961  Referring provider: Dr. Coralyn Mark Primary care provider: Dr. Coralyn Mark  Reason for consult:  Visual disturbance  HISTORY OF PRESENT ILLNESS: Katrina [name] is a 53 year old right-handed woman with history of lupus anticoagulant disorder, hypertension, Raynaud, mitral valve prolapse, osteopenia, diverticulosis, and GERD who presents for episodic visual disturbance.  Onset started 16 years ago. She experiences superior and left-sided whitening of the periphery ("like two C's") and associated with inability to see the right side of her visual fields. Occurs in both eyes. Lasts approximately one hour (or maybe an hour and a half). It is sudden onset. It usually occurs once a year. On 4 occasions over the past 10 years, it has been followed by severe headache associated with nausea, phonophobia and photophobia. Since they occur so infrequently and she usually has no associated headache, she does not take any medication for them. Triggers may include certain red wines. She drinks 2 cups of coffee a day. She'll have to glasses of white wine with dinner. She is a former smoker. 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 She will go through periods where she has little sleep.. She most recently had an episode a couple of weeks ago. She saw an ophthalmologist who found nothing wrong with her vision. It was suggested that she had optic migraines.  Family history is noted for her father who had history of "sinus headaches" and her sister who was diagnosed with basilar migraine epilepsy.  PAST MEDICAL HISTORY: Past Medical History  Diagnosis Date  . GERD (gastroesophageal reflux disease)   . Osteopenia   . Raynaud disease     question false positive antiphospholipid panel  . Vitamin D deficiency   . History of abnormal Pap smear     S/P lazer conization  . Menopause     . Mitral valve prolapse     no report of murmur  . Frozen shoulder     history of bilateral frozen shoulder  . Family history of breast cancer in first degree relative   . Lupus anticoagulant disorder   . Anal fissure   . Diverticulosis   . Hypertension   . C. difficile diarrhea   . Headache(784.0)     PAST SURGICAL HISTORY: Past Surgical History  Procedure Laterality Date  . Knee surgery  2006    left  . Foot surgery      left  . Wisdom tooth extraction  1981    MEDICATIONS: Current Outpatient Prescriptions on File Prior to Visit  Medication Sig Dispense Refill  . aspirin EC 81 MG tablet Take 81 mg by mouth 2 (two) times a week.      . conjugated estrogens (PREMARIN) vaginal cream Place 1 Applicatorful vaginally daily.      Marland Kitchen ibuprofen (ADVIL,MOTRIN) 200 MG tablet Take 200 mg by mouth every 8 (eight) hours as needed for pain.      . Probiotic Product (PROBIOTIC DAILY PO) Take by mouth.      . rizatriptan (MAXALT-MLT) 5 MG disintegrating tablet Melt one tablet at onset of headache or vision change May repeat in 2 hours one time only if needed  10 tablet  0  . zolpidem (AMBIEN) 10 MG tablet Take 5 mg by mouth at bedtime as needed for sleep.        No current facility-administered medications on file prior to visit.    ALLERGIES: Allergies  Allergen Reactions  . Cefuroxime Diarrhea and Nausea And Vomiting    FAMILY HISTORY: Family History  Problem Relation Age of Onset  . Anemia Father   . Heart disease Father     CHF  . Lung disease Father   . Colon cancer Mother     history of   . Breast cancer Mother   . Breast cancer Maternal Aunt   . Cancer Paternal Aunt     brain  . Heart disease Maternal Grandmother   . Cancer Maternal Grandfather     throat  . Stroke Sister     SOCIAL HISTORY: History   Social History  . Marital Status: Married    Spouse Name: N/A    Number of Children: 3  . Years of Education: N/A   Occupational History  . architect  Other  .     Social History Main Topics  . Smoking status: Former Smoker -- 1.00 packs/day for 6 years    Types: Cigarettes    Quit date: 07/02/1991  . Smokeless tobacco: Never Used  . Alcohol Use: 8.4 oz/week    14 Glasses of wine per week  . Drug Use: No  . Sexual Activity: Yes    Partners: Male   Other Topics Concern  . Not on file   Social History Narrative   Married      3 children       Occupation:  Arboriculturist        Former Smoker       Alcohol use-yes - (1-2 glasses of wine per night)          REVIEW OF SYSTEMS: Constitutional: No fevers, chills, or sweats, no generalized fatigue, change in appetite Eyes: No visual changes, double vision, eye pain Ear, nose and throat: No hearing loss, ear pain, nasal congestion, sore throat Cardiovascular: No chest pain, palpitations Respiratory:  No shortness of breath at rest or with exertion, wheezes GastrointestinaI: No nausea, vomiting, diarrhea, abdominal pain, fecal incontinence Genitourinary:  No dysuria, urinary retention or frequency Musculoskeletal:  No neck pain, back pain Integumentary: No rash, pruritus, skin lesions Neurological: as above Psychiatric: No depression, insomnia, anxiety Endocrine: No palpitations, fatigue, diaphoresis, mood swings, change in appetite, change in weight, increased thirst Hematologic/Lymphatic:  No anemia, purpura, petechiae. Allergic/Immunologic: no itchy/runny eyes, nasal congestion, recent allergic reactions, rashes  PHYSICAL EXAM: Filed Vitals:   01/22/14 0850  BP: 130/82  Pulse: 74  Temp: 98.5 F (36.9 C)  Resp: 16   General: No acute distress Head:  Normocephalic/atraumatic Neck: supple, no paraspinal tenderness, full range of motion Back: No paraspinal tenderness Heart: regular rate and rhythm Lungs: Clear to auscultation bilaterally. Vascular: No carotid bruits. Neurological Exam: Mental status: alert and oriented to person, place, and time, recent and remote memory  intact, fund of knowledge intact, attention and concentration intact, speech fluent and not dysarthric, language intact. Cranial nerves: CN I: not tested CN II: pupils equal, round and reactive to light, visual fields intact, fundi unremarkable, without vessel changes, exudates, hemorrhages or papilledema. CN III, IV, VI:  full range of motion, no nystagmus, no ptosis CN V: facial sensation intact CN VII: upper and lower face symmetric CN VIII: hearing intact CN IX, X: gag intact, uvula midline CN XI: sternocleidomastoid and trapezius muscles intact CN XII: tongue midline Bulk & Tone: normal, no fasciculations. Motor: 5 out of 5 throughout Sensation: Temperature and vibration intact Deep Tendon Reflexes: 2+ throughout, left toe downgoing, right toe equivocal Finger to nose  testing: No dysmetria Gait: Normal station and stride. Able to turn and walk in tandem. Romberg negative.  IMPRESSION: Retinal migraine  PLAN: Since these are habitual spells that have been occurring for the past 16 years, I don't think we need to pursue any imaging of the brain. Since they're so infrequent and typically not associated with headache, I don't think we need to start any medications. She may followup as needed, such as any change in the symptoms of these episodes.  45 minutes spent with the patient, over 50% spent counseling and coordinating care.  Thank you for allowing me to take part in the care of this patient.  Metta Clines, DO  CC:  Emi Belfast, MD

## 2014-01-27 ENCOUNTER — Encounter: Payer: Self-pay | Admitting: Gastroenterology

## 2014-04-10 ENCOUNTER — Ambulatory Visit (AMBULATORY_SURGERY_CENTER): Payer: Self-pay | Admitting: *Deleted

## 2014-04-10 VITALS — Ht 66.0 in | Wt 138.4 lb

## 2014-04-10 DIAGNOSIS — Z800 Family history of malignant neoplasm of digestive organs: Secondary | ICD-10-CM

## 2014-04-10 MED ORDER — MOVIPREP 100 G PO SOLR: ORAL | Status: DC

## 2014-04-10 NOTE — Progress Notes (Signed)
No allergies to eggs or soy. No problems with anesthesia.  Pt given Emmi instructions for colonoscopy  No oxygen use  No diet drug use  

## 2014-04-15 ENCOUNTER — Encounter: Payer: Self-pay | Admitting: Gastroenterology

## 2014-04-16 ENCOUNTER — Encounter: Payer: Self-pay | Admitting: Gastroenterology

## 2014-04-22 ENCOUNTER — Ambulatory Visit (AMBULATORY_SURGERY_CENTER): Payer: BC Managed Care – PPO | Admitting: Gastroenterology

## 2014-04-22 ENCOUNTER — Encounter: Payer: Self-pay | Admitting: Gastroenterology

## 2014-04-22 VITALS — BP 127/86 | HR 58 | Temp 97.4°F | Resp 21 | Ht 66.0 in | Wt 138.0 lb

## 2014-04-22 DIAGNOSIS — D126 Benign neoplasm of colon, unspecified: Secondary | ICD-10-CM

## 2014-04-22 DIAGNOSIS — Z800 Family history of malignant neoplasm of digestive organs: Secondary | ICD-10-CM

## 2014-04-22 DIAGNOSIS — D120 Benign neoplasm of cecum: Secondary | ICD-10-CM

## 2014-04-22 DIAGNOSIS — Z1211 Encounter for screening for malignant neoplasm of colon: Secondary | ICD-10-CM

## 2014-04-22 MED ORDER — SODIUM CHLORIDE 0.9 % IV SOLN: 500.0000 mL | INTRAVENOUS | Status: DC

## 2014-04-22 NOTE — Progress Notes (Signed)
Report to PACU, RN, vss, BBS= Clear.  

## 2014-04-22 NOTE — Op Note (Signed)
[place]  [address] [place], [address]   COLONOSCOPY PROCEDURE REPORT  PATIENT: Katrina [name], Katrina [name]  MR#: [id number] BIRTHDATE: August 12, 1961 , 53  yrs. old GENDER: Female ENDOSCOPIST: Ladene Artist, MD, [facility] PROCEDURE DATE:  04/22/2014 PROCEDURE:   Colonoscopy with snare polypectomy First Screening Colonoscopy - Avg.  risk and is 50 yrs.  old or older - No.  Prior Negative Screening - Now for repeat screening. Above average risk  History of Adenoma - Now for follow-up colonoscopy & has been > or = to 3 yrs.  N/A  Polyps Removed Today? Yes. ASA CLASS:   Class II INDICATIONS:Patient's immediate family history of colon cancer. MEDICATIONS: MAC sedation, administered by CRNA and propofol (Diprivan) 350mg  IV DESCRIPTION OF PROCEDURE:   After the risks benefits and alternatives of the procedure were thoroughly explained, informed consent was obtained.  A digital rectal exam revealed no abnormalities of the rectum.   The LB [id number]  endoscope was introduced through the anus and advanced to the cecum, which was identified by both the appendix and ileocecal valve. No adverse events experienced.   The quality of the prep was excellent, using MoviPrep  The instrument was then slowly withdrawn as the colon was fully examined.  COLON FINDINGS: A sessile polyp measuring 5 mm in size was found at the cecum.  A polypectomy was performed with a cold snare.  The resection was complete and the polyp tissue was completely retrieved.   Mild diverticulosis was noted in the sigmoid colon. The colon was otherwise normal.  There was no diverticulosis, inflammation, polyps or cancers unless previously stated. Retroflexed views revealed no abnormalities. The time to cecum=5 minutes 00 seconds.  Withdrawal time=10 minutes 51 seconds.  The scope was withdrawn and the procedure completed.  COMPLICATIONS: There were no complications.  ENDOSCOPIC IMPRESSION: 1.    Sessile polyp measuring 5 mm at the cecum; polypectomy performed with a cold snare 2.   Very mild diverticulosis  in the sigmoid colon  RECOMMENDATIONS: 1.  Await pathology results 2.  Repeat Colonoscopy in 5 years.  eSigned:  Ladene Artist, MD, [facility] 04/22/2014 9:27 AM

## 2014-04-22 NOTE — Progress Notes (Signed)
Called to room to assist during endoscopic procedure.  Patient ID and intended procedure confirmed with present staff. Received instructions for my participation in the procedure from the performing physician.  

## 2014-04-22 NOTE — Patient Instructions (Signed)
YOU HAD AN ENDOSCOPIC PROCEDURE TODAY AT THE [facility]: Refer to the procedure report that was given to you for any specific questions about what was found during the examination.  If the procedure report does not answer your questions, please call your gastroenterologist to clarify.  If you requested that your care partner not be given the details of your procedure findings, then the procedure report has been included in a sealed envelope for you to review at your convenience later.  YOU SHOULD EXPECT: Some feelings of bloating in the abdomen. Passage of more gas than usual.  Walking can help get rid of the air that was put into your GI tract during the procedure and reduce the bloating. If you had a lower endoscopy (such as a colonoscopy or flexible sigmoidoscopy) you may notice spotting of blood in your stool or on the toilet paper. If you underwent a bowel prep for your procedure, then you may not have a normal bowel movement for a few days.  DIET: Your first meal following the procedure should be a light meal and then it is ok to progress to your normal diet.  A half-sandwich or bowl of soup is an example of a good first meal.  Heavy or fried foods are harder to digest and may make you feel nauseous or bloated.  Likewise meals heavy in dairy and vegetables can cause extra gas to form and this can also increase the bloating.  Drink plenty of fluids but you should avoid alcoholic beverages for 24 hours.  ACTIVITY: Your care partner should take you home directly after the procedure.  You should plan to take it easy, moving slowly for the rest of the day.  You can resume normal activity the day after the procedure however you should NOT DRIVE or use heavy machinery for 24 hours (because of the sedation medicines used during the test).    SYMPTOMS TO REPORT IMMEDIATELY: A gastroenterologist can be reached at any hour.  During normal business hours, 8:30 AM to 5:00 PM Monday through Friday,  call [phone].  After hours and on weekends, please call the GI answering service at [phone] who will take a message and have the physician on call contact you.   Following lower endoscopy (colonoscopy or flexible sigmoidoscopy):  Excessive amounts of blood in the stool  Significant tenderness or worsening of abdominal pains  Swelling of the abdomen that is new, acute  Fever of 100F or higher  FOLLOW UP: If any biopsies were taken you will be contacted by phone or by letter within the next 1-3 weeks.  Call your gastroenterologist if you have not heard about the biopsies in 3 weeks.  Our staff will call the home number listed on your records the next business day following your procedure to check on you and address any questions or concerns that you may have at that time regarding the information given to you following your procedure. This is a courtesy call and so if there is no answer at the home number and we have not heard from you through the emergency physician on call, we will assume that you have returned to your regular daily activities without incident.  SIGNATURES/CONFIDENTIALITY: You and/or your care partner have signed paperwork which will be entered into your electronic medical record.  These signatures attest to the fact that that the information above on your After Visit Summary has been reviewed and is understood.  Full responsibility of the confidentiality of this   discharge information lies with you and/or your care-partner.  Recommendations Await pathology results Next colonoscopy in 5 years. 

## 2014-04-23 ENCOUNTER — Telehealth: Payer: Self-pay | Admitting: *Deleted

## 2014-04-23 NOTE — Telephone Encounter (Signed)
  Follow up Call-  Call back number 04/22/2014  Post procedure Call Back phone  # [phone]  Permission to leave phone message Yes     Patient questions:  Do you have a fever, pain , or abdominal swelling? No. Pain Score  0 *  Have you tolerated food without any problems? Yes.    Have you been able to return to your normal activities? Yes.    Do you have any questions about your discharge instructions: Diet   No. Medications  No. Follow up visit  No.  Do you have questions or concerns about your Care? No.  Actions: * If pain score is 4 or above: No action needed, pain <4.

## 2014-04-25 ENCOUNTER — Telehealth: Payer: Self-pay | Admitting: Gastroenterology

## 2014-04-25 NOTE — Telephone Encounter (Signed)
Procedure from the other day reviewed. The patient has had no problems until today. Not sure of the etiology of symptoms, but I do not relate this to the procedure. May be the beginning of a viral illness. Symptomatic treatment at this time. Contact her PCP or go to urgent care/ER for significant worsening.

## 2014-04-25 NOTE — Telephone Encounter (Signed)
Patient reports that she had a colonoscopy with snare polypectomy on 8/25 with Dr. Fuller Plan.  She reports severe bloating, abdominal cramping, chills, and vomiting when trying to have a BM.  She reports that this am was the first BM she had.  She states that she has been drinking lots of water and when trying to have a BM this am she had vomiting "water was coming back up".  She denies straining to have a BM.  She is at work but wanted some advise prior to the weekend.  Dr. Henrene Pastor you are MD of the day.  Please advise

## 2014-04-25 NOTE — Telephone Encounter (Signed)
Patient notified.  She is advised to try room temperature liquids to help with the bloating.  She will go to the ER/Urgent care if her symptoms worsen.  She will call back for any additional questions or concerns

## 2014-04-28 ENCOUNTER — Encounter: Payer: Self-pay | Admitting: Gastroenterology

## 2015-01-12 ENCOUNTER — Ambulatory Visit (INDEPENDENT_AMBULATORY_CARE_PROVIDER_SITE_OTHER): Payer: BLUE CROSS/BLUE SHIELD | Admitting: Family Medicine

## 2015-01-12 ENCOUNTER — Encounter: Payer: Self-pay | Admitting: Family Medicine

## 2015-01-12 VITALS — BP 120/78 | HR 80 | Temp 98.6°F | Ht 65.5 in | Wt 136.5 lb

## 2015-01-12 DIAGNOSIS — Z87828 Personal history of other (healed) physical injury and trauma: Secondary | ICD-10-CM

## 2015-01-12 DIAGNOSIS — K219 Gastro-esophageal reflux disease without esophagitis: Secondary | ICD-10-CM

## 2015-01-12 DIAGNOSIS — K5900 Constipation, unspecified: Secondary | ICD-10-CM | POA: Diagnosis not present

## 2015-01-12 DIAGNOSIS — Z8739 Personal history of other diseases of the musculoskeletal system and connective tissue: Secondary | ICD-10-CM

## 2015-01-12 DIAGNOSIS — I341 Nonrheumatic mitral (valve) prolapse: Secondary | ICD-10-CM

## 2015-01-12 DIAGNOSIS — Z7189 Other specified counseling: Secondary | ICD-10-CM | POA: Diagnosis not present

## 2015-01-12 DIAGNOSIS — Z7689 Persons encountering health services in other specified circumstances: Secondary | ICD-10-CM

## 2015-01-12 DIAGNOSIS — M949 Disorder of cartilage, unspecified: Secondary | ICD-10-CM

## 2015-01-12 DIAGNOSIS — Z23 Encounter for immunization: Secondary | ICD-10-CM | POA: Diagnosis not present

## 2015-01-12 DIAGNOSIS — G43809 Other migraine, not intractable, without status migrainosus: Secondary | ICD-10-CM

## 2015-01-12 DIAGNOSIS — M899 Disorder of bone, unspecified: Secondary | ICD-10-CM

## 2015-01-12 NOTE — Progress Notes (Signed)
HPI:  Katrina [name] is here to establish care. She reports has a hx of MVP and HTN - used to see Dr. Acie [name] but is well controlled now. She uses vaginal estrogen sometimes for vaginal atrophy. She has a history of optic migraines and has seen her neurologist for this. Sees Dr. Fuller [name] for her chronic constipation and GERD - now doing ok with this. She has a meniscal tear in her R knee and is seeing [place] [facility]. Last PCP and physical: sees Dr. Corinna [name] in gyn yearly. Has mild osteopenia and take Vit D for this.  Has the following chronic problems that require follow up and concerns today:  ROS negative for unless reported above: fevers, unintentional weight loss, hearing or vision loss, chest pain, palpitations, struggling to breath, hemoptysis, melena, hematochezia, hematuria, falls, loc, si, thoughts of self harm  Past Medical History  Diagnosis Date  . GERD (gastroesophageal reflux disease)   . Osteopenia   . Raynaud disease     question false positive antiphospholipid panel  . Vitamin D deficiency   . History of abnormal Pap smear     S/P lazer conization  . Menopause   . Mitral valve prolapse     no report of murmur  . Frozen shoulder     history of bilateral frozen shoulder  . Family history of breast cancer in first degree relative   . Lupus anticoagulant disorder   . Anal fissure   . Diverticulosis   . Hypertension   . C. difficile diarrhea     04/2013  . Headache(784.0)   . Constipation     chronic her entire life    Past Surgical History  Procedure Laterality Date  . Knee surgery  2006    left  . Foot surgery  2007    left  . Wisdom tooth extraction  1981    Family History  Problem Relation Age of Onset  . Anemia Father   . Heart disease Father     CHF  . Lung disease Father   . Colon cancer Mother 35  . Breast cancer Mother   . Breast cancer Maternal Aunt   . Cancer Paternal Aunt     brain  . Heart disease Maternal Grandmother   . Cancer  Maternal Grandfather     throat  . Stroke Sister     History   Social History  . Marital Status: Married    Spouse Name: N/A  . Number of Children: 3  . Years of Education: N/A   Occupational History  . architect Other  .     Social History Main Topics  . Smoking status: Former Smoker -- 1.00 packs/day for 6 years    Types: Cigarettes    Quit date: 07/02/1991  . Smokeless tobacco: Never Used  . Alcohol Use: Yes     Comment: 1-2 glasses of wine rarely  . Drug Use: No  . Sexual Activity:    Partners: Male   Other Topics Concern  . 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  aspirin EC 81 MG tablet, Take 81 mg by mouth 2 (two) times a week., Disp: , Rfl:  .  conjugated estrogens (PREMARIN) vaginal cream, Place 1 Applicatorful vaginally daily., Disp: , Rfl:   EXAM:  Filed Vitals:   01/12/15 1620  BP: 120/78  Pulse: 80  Temp: 98.6 F (37 C)    Body mass index is 22.36 kg/(m^2).  GENERAL: vitals reviewed and listed above, alert, oriented, appears well hydrated and in no acute distress  HEENT: atraumatic, conjunttiva clear, no obvious abnormalities on inspection of external nose and ears  NECK: no obvious masses on inspection  LUNGS: clear to auscultation bilaterally, no wheezes, rales or rhonchi, good air movement  CV: HRRR, no peripheral edema  MS: moves all extremities without noticeable abnormality  PSYCH: pleasant and cooperative, no obvious depression or anxiety  ASSESSMENT AND [name]:  Discussed the following assessment and [name]:  History of meniscal tear  Encounter to establish  care  Gastroesophageal reflux disease, esophagitis presence not specified  Constipation, unspecified constipation type  Other migraine without status migrainosus, not intractable  Mitral valve prolapse  Disorder of bone and cartilage -We reviewed the PMH, PSH, FH, SH, Meds and Allergies. -We provided refills for any medications we will prescribe as needed. -We addressed current concerns per orders and patient instructions. -We have asked for records for pertinent exams, studies, vaccines and notes from previous providers. -We have advised patient to follow up per instructions below. -Tdap today, follow up for physical in 3-6 months  -Patient advised to return or notify a doctor immediately if symptoms worsen or persist or new concerns arise.  Patient Instructions  BEFORE YOU LEAVE: -Tdap -physical in 3-6 months  We recommend the following healthy lifestyle measures: - eat a healthy diet consisting of lots of vegetables, fruits, beans, nuts, seeds, healthy meats such as white chicken and fish and whole grains.  - avoid fried foods, fast food, processed foods, sodas, red meet and other fattening foods.  - get a least 150 minutes of aerobic exercise per week.       Colin Benton R.

## 2015-01-12 NOTE — Progress Notes (Signed)
Pre visit review using our clinic review tool, if applicable. No additional management support is needed unless otherwise documented below in the visit note. 

## 2015-01-12 NOTE — Patient Instructions (Addendum)
BEFORE YOU LEAVE: -Tdap -physical in 3-6 months  We recommend the following healthy lifestyle measures: - eat a healthy diet consisting of lots of vegetables, fruits, beans, nuts, seeds, healthy meats such as white chicken and fish and whole grains.  - avoid fried foods, fast food, processed foods, sodas, red meet and other fattening foods.  - get a least 150 minutes of aerobic exercise per week.

## 2015-01-12 NOTE — Addendum Note (Signed)
Addended by: Agnes Lawrence on: 01/12/2015 05:08 PM   Modules accepted: Orders

## 2015-02-05 ENCOUNTER — Telehealth: Payer: Self-pay | Admitting: Gastroenterology

## 2015-02-05 NOTE — Telephone Encounter (Signed)
Patient is not having diarrhea.  She does have more frequent stools, but they are solid.  She was placed on antibiotics prior to surgery a few weeks ago.  She is advised that she could try Florastor one box until gone.  She will call back if her symptoms worsen.

## 2015-04-02 ENCOUNTER — Other Ambulatory Visit: Payer: Self-pay | Admitting: Obstetrics and Gynecology

## 2015-04-03 LAB — CYTOLOGY - PAP

## 2015-05-19 ENCOUNTER — Encounter: Payer: BLUE CROSS/BLUE SHIELD | Admitting: Family Medicine

## 2015-07-14 ENCOUNTER — Encounter: Payer: Self-pay | Admitting: Family Medicine

## 2015-07-14 ENCOUNTER — Ambulatory Visit (INDEPENDENT_AMBULATORY_CARE_PROVIDER_SITE_OTHER): Payer: BLUE CROSS/BLUE SHIELD | Admitting: Family Medicine

## 2015-07-14 VITALS — BP 110/78 | HR 88 | Temp 98.4°F | Ht 65.5 in | Wt 136.7 lb

## 2015-07-14 DIAGNOSIS — Z23 Encounter for immunization: Secondary | ICD-10-CM

## 2015-07-14 DIAGNOSIS — Z0000 Encounter for general adult medical examination without abnormal findings: Secondary | ICD-10-CM

## 2015-07-14 LAB — HEPATITIS C ANTIBODY: HCV Ab: NEGATIVE

## 2015-07-14 LAB — LIPID PANEL
Cholesterol: 265 mg/dL — ABNORMAL HIGH (ref 0–200)
HDL: 104.4 mg/dL (ref >39.00)
LDL Cholesterol: 147 mg/dL — ABNORMAL HIGH (ref 0–99)
NonHDL: 160.8
Total CHOL/HDL Ratio: 3
Triglycerides: 68 mg/dL (ref 0.0–149.0)
VLDL: 13.6 mg/dL (ref 0.0–40.0)

## 2015-07-14 LAB — HEMOGLOBIN A1C: Hgb A1c MFr Bld: 5.8 % (ref 4.6–6.5)

## 2015-07-14 NOTE — Progress Notes (Signed)
Pre visit review using our clinic review tool, if applicable. No additional management support is needed unless otherwise documented below in the visit note. 

## 2015-07-14 NOTE — Progress Notes (Signed)
HPI:  Here for CPE:  -Concerns and/or follow up today:   HTN/Hx MVP: -meds:no meds -denies: CP, SOB, DOE  Optic Migraines: -sees neurologist  Chronic constipation/GERD: -sees Dr. Fuller Plan  Meniscal tear: -goes to [facility], getting MRI next week as has had slow recovery from surgery on R knee   Osteopenia: -stopped taking her Vit D  Vaginal Atrophy: -uses vaginal estrogen intermittently  -Diet: variety of foods, balance and well rounded, larger portion sizes  -Exercise: no regular exercise  -Taking folic acid, vitamin D or calcium: no  -Diabetes and Dyslipidemia Screening:  -Hx of HTN: no  -Vaccines: UTD  -pap history: sees Dr. Corinna Capra yearly  -FDLMP:  -sexual activity: yes, female partner, no new partners  -wants STI testing (Hep C if born 60-65): no  -FH breast, colon or ovarian ca: see FH Last mammogram: Last colon cancer screening:  Breast Ca Risk Assessment: -SolutionApps.it  Genetic Counseling Screen: Http://www.breastcancergenesscreen.org/startScreen.aspx  FRAX (50-65):  DEXA (>/= 65):   -Alcohol, Tobacco, drug use: see social history  Review of Systems - no fevers, unintentional weight loss, vision loss, hearing loss, chest pain, sob, hemoptysis, melena, hematochezia, hematuria, genital discharge, changing or concerning skin lesions, bleeding, bruising, loc, thoughts of self harm or SI  Past Medical History  Diagnosis Date  . GERD (gastroesophageal reflux disease)   . Osteopenia   . Raynaud disease     question false positive antiphospholipid panel  . Vitamin D deficiency   . History of abnormal Pap smear     S/P lazer conization  . Menopause   . Mitral valve prolapse     no report of murmur  . Frozen shoulder     history of bilateral frozen shoulder  . Family history of breast cancer in first degree relative   . Lupus anticoagulant disorder ([place])     refuted by hematologist in 2014  . Anal fissure   .  Diverticulosis   . Hypertension   . C. difficile diarrhea     04/2013  . Headache(784.0)   . Constipation     chronic her entire life    Past Surgical History  Procedure Laterality Date  . Knee surgery  2006    left  . Foot surgery  2007    left  . Wisdom tooth extraction  1981    Family History  Problem Relation Age of Onset  . Anemia Father   . Heart disease Father     CHF  . Lung disease Father   . Colon cancer Mother 70  . Breast cancer Mother   . Breast cancer Maternal Aunt   . Cancer Paternal Aunt     brain  . Heart disease Maternal Grandmother   . Cancer Maternal Grandfather     throat  . Stroke Sister     Social History   Social History  . Marital Status: Married    Spouse Name: N/A  . Number of Children: 3  . Years of Education: N/A   Occupational History  . architect Other  .     Social History Main Topics  . Smoking status: Former Smoker -- 1.00 packs/day for 6 years    Types: Cigarettes    Quit date: 07/02/1991  . Smokeless tobacco: Never Used  . Alcohol Use: Yes     Comment: 1-2 glasses of wine rarely  . Drug Use: No  . Sexual Activity:    Partners: Male   Other Topics Concern  . 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  aspirin EC 81 MG tablet, Take 81 mg by mouth 2 (two) times a week., Disp: , Rfl:   EXAM:  Filed Vitals:   07/14/15 1145  BP: 110/78  Pulse: 88  Temp: 98.4 F (36.9 C)    GENERAL: vitals reviewed and listed below, alert, oriented, appears well hydrated and in no acute distress  HEENT: head atraumatic, PERRLA, normal appearance of eyes, ears, nose and  mouth. moist mucus membranes.  NECK: supple, no masses or lymphadenopathy  LUNGS: clear to auscultation bilaterally, no rales, rhonchi or wheeze  CV: HRRR, no peripheral edema or cyanosis, normal pedal pulses  BREAST: declined, does with gyn  ABDOMEN: bowel sounds normal, soft, non tender to palpation, no masses, no rebound or guarding  GU: declined, does with gyn  SKIN: no rash or abnormal lesions  MS: normal gait, moves all extremities normally  NEURO: CN II-XII grossly intact, normal muscle strength and sensation to light touch on extremities  PSYCH: normal affect, pleasant and cooperative  ASSESSMENT AND PLAN:  Discussed the following assessment and plan:  Visit for preventive health examination - Plan: Lipid Panel, Hemoglobin A1c, Hepatitis C antibody  -Discussed and advised all [place] preventive services health task force level A and B recommendations for age, sex and risks.  -Advised at least 150 minutes of exercise per week and a healthy diet low in saturated fats and sweets and consisting of fresh fruits and vegetables, lean meats such as fish and white chicken and whole grains.  -FASTING labs, studies and vaccines per orders this encounter  Orders Placed This Encounter  Procedures  . Lipid Panel  . Hemoglobin A1c  . Hepatitis C antibody    Patient advised to return to clinic immediately if symptoms worsen or persist or new concerns.  Patient Instructions  BEFORE YOU LEAVE: -flu shot -labs -follow up yearly and as needed  Take vit D [phone] IU daily  -We have ordered labs or studies at this visit. It can take up to 1-2 weeks for results and processing. We will contact you with instructions IF your results are abnormal. Normal results will be released to your [facility]. If you have not heard from [place] or can not find your results in [facility] in 2 weeks please contact our office.  We recommend the following healthy lifestyle measures: - eat a healthy whole foods  diet consisting of regular small meals composed of vegetables, fruits, beans, nuts, seeds, healthy meats such as white chicken and fish and whole grains.  - avoid sweets, white starchy foods, fried foods, fast food, processed foods, sodas, red meet and other fattening foods.  - get a least 150-300 minutes of aerobic exercise per week.           No Follow-up on file.  Colin Benton R.

## 2015-07-14 NOTE — Patient Instructions (Signed)
BEFORE YOU LEAVE: -flu shot -labs -follow up yearly and as needed  Take vit D [phone] IU daily  -We have ordered labs or studies at this visit. It can take up to 1-2 weeks for results and processing. We will contact you with instructions IF your results are abnormal. Normal results will be released to your [place]. If you have not heard from [place] or can not find your results in [facility] in 2 weeks please contact our office.  We recommend the following healthy lifestyle measures: - eat a healthy whole foods diet consisting of regular small meals composed of vegetables, fruits, beans, nuts, seeds, healthy meats such as white chicken and fish and whole grains.  - avoid sweets, white starchy foods, fried foods, fast food, processed foods, sodas, red meet and other fattening foods.  - get a least 150-300 minutes of aerobic exercise per week.

## 2015-12-01 ENCOUNTER — Ambulatory Visit (INDEPENDENT_AMBULATORY_CARE_PROVIDER_SITE_OTHER): Payer: BLUE CROSS/BLUE SHIELD | Admitting: Cardiovascular Disease

## 2015-12-01 ENCOUNTER — Encounter: Payer: Self-pay | Admitting: Cardiovascular Disease

## 2015-12-01 VITALS — BP 124/90 | HR 87 | Ht 65.5 in | Wt 140.6 lb

## 2015-12-01 DIAGNOSIS — E785 Hyperlipidemia, unspecified: Secondary | ICD-10-CM | POA: Diagnosis not present

## 2015-12-01 DIAGNOSIS — I341 Nonrheumatic mitral (valve) prolapse: Secondary | ICD-10-CM | POA: Diagnosis not present

## 2015-12-01 MED ORDER — ATORVASTATIN CALCIUM 20 MG PO TABS: 20.0000 mg | ORAL_TABLET | Freq: Every day | ORAL | Status: DC

## 2015-12-01 NOTE — Patient Instructions (Signed)
Medication Instructions:  START Atorvastatin 20 mg once daily   Labwork: Your physician recommends that you return for lab work in: 3 months (cholesterol, complete metabolic panel) You will need to FAST for this appointment - nothing to eat or drink after midnight the night before except water.   Testing/Procedures: None Ordered   Follow-Up: Your physician wants you to follow-up in: 2 years with Dr. Acie Fredrickson.  You will receive a reminder letter in the mail two months in advance. If you don't receive a letter, please call our office to schedule the follow-up appointment.   If you need a refill on your cardiac medications before your next appointment, please call your pharmacy.   Thank you for choosing [facility]! Christen Bame, RN [phone]

## 2015-12-01 NOTE — Progress Notes (Signed)
[name] Date of Birth  [birth year] [place] [address]    [place]   [place], [place]  [address]    [place], [place]  [address] [phone]  Fax  [phone]  [phone]  Fax [phone]  Problem List: 1. Hypetension 2. Mitral valve prolapse.  3. Hyperlipidemia   History of Present Illness:  Katrina [name] is a 55 yo who presents today for evaluation of HTN.  She was noted to have elevation of her diastolic blood pressure.  She's also been having some dyspnea with exertion. She went hiking at the [facility] [place] recently and had significant shortness breath climbing up the trail.  She feels like her metabolism has slowed recently.  She walks her dogs 2 miles a day without problems.   She has been working out and walking regularly.    November 09, 2012:  Katrina [name] was tried on Losartan - she was feeling poorly and her primary recommended that she stop.  She stopped the Losartan and is feeling better.   December 01, 2015:  Katrina [name] is seen back today after 2 year absence. Doing ok Had some pleurisy last month.  BP was a little elevated with that .  Has had some heart racing when traveling [place].   Walk every day.     Current Outpatient Prescriptions on File Prior to Visit  Medication Sig Dispense Refill  . aspirin EC 81 MG tablet Take 81 mg by mouth 2 (two) times a week.     No current facility-administered medications on file prior to visit.    Allergies  Allergen Reactions  . Cefuroxime Diarrhea and Nausea And Vomiting    Caused C Dif    Past Medical History  Diagnosis Date  . GERD (gastroesophageal reflux disease)   . Osteopenia   . Raynaud disease     question false positive antiphospholipid panel  . Vitamin D deficiency   . History of abnormal Pap smear     S/P lazer conization  . Menopause   . Mitral valve prolapse     no report of murmur  . Frozen shoulder     history of bilateral frozen  shoulder  . Family history of breast cancer in first degree relative   . Lupus anticoagulant disorder ([place])     refuted by hematologist in 2014  . Anal fissure   . Diverticulosis   . Hypertension   . C. difficile diarrhea     04/2013  . Headache(784.0)   . Constipation     chronic her entire life    Past Surgical History  Procedure Laterality Date  . Knee surgery  2006    left  . Foot surgery  2007    left  . Wisdom tooth extraction  1981    History  Smoking status  . Former Smoker -- 1.00 packs/day for 6 years  . Types: Cigarettes  . Quit date: 07/02/1991  Smokeless tobacco  . Never Used    History  Alcohol Use  . Yes    Comment: 1-2 glasses of wine rarely    Family History  Problem Relation Age of Onset  . Anemia Father   . Heart disease Father     CHF  . Lung disease Father   . Colon cancer Mother 67  . Breast cancer Mother   . Breast cancer Maternal Aunt   . Cancer Paternal Aunt     brain  .  Heart disease Maternal Grandmother   . Cancer Maternal Grandfather     throat  . Stroke Sister     Reviw of Systems:  Reviewed in the HPI.  All other systems are negative.  Physical Exam: Blood pressure 124/90, pulse 87, height 5' 5.5" (1.664 m), weight 140 lb 9.6 oz (63.776 kg). General: Well developed, well nourished, in no acute distress.  Head: Normocephalic, atraumatic, sclera non-icteric, mucus membranes are moist,   Neck: Supple. Carotids are 2 + without bruits. No JVD  Lungs: Clear bilaterally to auscultation.  Heart: regular rate .  There is a soft murmur.  Abdomen: Soft, non-tender, non-distended with normal bowel sounds. No hepatomegaly. No rebound/guarding. No masses.  Msk:  Strength and tone are normal  Extremities: No clubbing or cyanosis. No edema.  Distal pedal pulses are 2+ and equal bilaterally.  Neuro: Alert and oriented X 3. Moves all extremities spontaneously.  Psych:  Responds to questions appropriately with a normal  affect.  ECG:  Assessment / Plan:    1. Hypertension - well controlled   2. Mitral valve prolapse.  Stable   3. Hyperlipidemia Her last LDL is 140 .  Has a family hx of CAD. Will start her on atorvastatin 20 a day .   Check lipids and CMET in 3 months . She will ask Dr. Maudie Mercury follow up with that.   Alternatively , I've offered to follow this for her   I'll see her in 2 years.    Herald Vallin, Wonda Cheng, MD  12/01/2015 10:37 AM    [place],  [place], [place]  [address] Pager [phone] Phone: [phone]; Fax: [phone]   [facility]  [address] [place], [place]  [address] [phone]   Fax [phone]

## 2015-12-16 DIAGNOSIS — M25561 Pain in right knee: Secondary | ICD-10-CM | POA: Diagnosis not present

## 2015-12-16 DIAGNOSIS — M25661 Stiffness of right knee, not elsewhere classified: Secondary | ICD-10-CM | POA: Diagnosis not present

## 2015-12-18 DIAGNOSIS — M25561 Pain in right knee: Secondary | ICD-10-CM | POA: Diagnosis not present

## 2015-12-18 DIAGNOSIS — M25661 Stiffness of right knee, not elsewhere classified: Secondary | ICD-10-CM | POA: Diagnosis not present

## 2015-12-21 DIAGNOSIS — M25561 Pain in right knee: Secondary | ICD-10-CM | POA: Diagnosis not present

## 2015-12-25 DIAGNOSIS — M25561 Pain in right knee: Secondary | ICD-10-CM | POA: Diagnosis not present

## 2015-12-25 DIAGNOSIS — M25661 Stiffness of right knee, not elsewhere classified: Secondary | ICD-10-CM | POA: Diagnosis not present

## 2015-12-30 DIAGNOSIS — M25561 Pain in right knee: Secondary | ICD-10-CM | POA: Diagnosis not present

## 2016-02-09 ENCOUNTER — Telehealth: Payer: Self-pay | Admitting: Cardiovascular Disease

## 2016-02-09 ENCOUNTER — Encounter (HOSPITAL_COMMUNITY): Payer: Self-pay | Admitting: Nurse Practitioner

## 2016-02-09 ENCOUNTER — Emergency Department (HOSPITAL_COMMUNITY)
Admission: EM | Admit: 2016-02-09 | Discharge: 2016-02-09 | Disposition: A | Discharge status: Home / Self Care | Payer: BLUE CROSS/BLUE SHIELD | Attending: Emergency Medicine | Admitting: Emergency Medicine

## 2016-02-09 ENCOUNTER — Emergency Department (HOSPITAL_COMMUNITY): Payer: BLUE CROSS/BLUE SHIELD

## 2016-02-09 DIAGNOSIS — R4182 Altered mental status, unspecified: Secondary | ICD-10-CM | POA: Diagnosis not present

## 2016-02-09 DIAGNOSIS — Z7982 Long term (current) use of aspirin: Secondary | ICD-10-CM | POA: Diagnosis not present

## 2016-02-09 DIAGNOSIS — J069 Acute upper respiratory infection, unspecified: Secondary | ICD-10-CM | POA: Diagnosis not present

## 2016-02-09 DIAGNOSIS — R42 Dizziness and giddiness: Secondary | ICD-10-CM | POA: Diagnosis not present

## 2016-02-09 DIAGNOSIS — Z87891 Personal history of nicotine dependence: Secondary | ICD-10-CM | POA: Diagnosis not present

## 2016-02-09 DIAGNOSIS — I10 Essential (primary) hypertension: Secondary | ICD-10-CM | POA: Diagnosis not present

## 2016-02-09 LAB — COMPREHENSIVE METABOLIC PANEL
ALT: 21 U/L (ref 14–54)
AST: 23 U/L (ref 15–41)
Albumin: 4.5 g/dL (ref 3.5–5.0)
Alkaline Phosphatase: 42 U/L (ref 38–126)
Anion gap: 9 (ref 5–15)
BUN: 9 mg/dL (ref 6–20)
CO2: 24 mmol/L (ref 22–32)
Calcium: 9.8 mg/dL (ref 8.9–10.3)
Chloride: 105 mmol/L (ref 101–111)
Creatinine, Ser: 0.77 mg/dL (ref 0.44–1.00)
GFR calc Af Amer: >60 mL/min (ref >60)
GFR calc non Af Amer: >60 mL/min (ref >60)
Glucose, Bld: 97 mg/dL (ref 65–99)
Potassium: 3.5 mmol/L (ref 3.5–5.1)
Sodium: 138 mmol/L (ref 135–145)
Total Bilirubin: 0.9 mg/dL (ref 0.3–1.2)
Total Protein: 7 g/dL (ref 6.5–8.1)

## 2016-02-09 LAB — CBC
HCT: 40.7 % (ref 36.0–46.0)
Hemoglobin: 13.1 g/dL (ref 12.0–15.0)
MCH: 31.6 pg (ref 26.0–34.0)
MCHC: 32.2 g/dL (ref 30.0–36.0)
MCV: 98.1 fL (ref 78.0–100.0)
Platelets: 243 10*3/uL (ref 150–400)
RBC: 4.15 MIL/uL (ref 3.87–5.11)
RDW: 12.1 % (ref 11.5–15.5)
WBC: 5.3 10*3/uL (ref 4.0–10.5)

## 2016-02-09 LAB — DIFFERENTIAL
Basophils Absolute: 0 10*3/uL (ref 0.0–0.1)
Basophils Relative: 0 %
Eosinophils Absolute: 0.1 10*3/uL (ref 0.0–0.7)
Eosinophils Relative: 3 %
Lymphocytes Relative: 42 %
Lymphs Abs: 2.2 10*3/uL (ref 0.7–4.0)
Monocytes Absolute: 0.4 10*3/uL (ref 0.1–1.0)
Monocytes Relative: 7 %
Neutro Abs: 2.6 10*3/uL (ref 1.7–7.7)
Neutrophils Relative %: 48 %

## 2016-02-09 LAB — I-STAT CHEM 8, ED
BUN: 10 mg/dL (ref 6–20)
Calcium, Ion: 1.16 mmol/L (ref 1.12–1.23)
Chloride: 103 mmol/L (ref 101–111)
Creatinine, Ser: 0.7 mg/dL (ref 0.44–1.00)
Glucose, Bld: 96 mg/dL (ref 65–99)
HCT: 43 % (ref 36.0–46.0)
Hemoglobin: 14.6 g/dL (ref 12.0–15.0)
Potassium: 3.5 mmol/L (ref 3.5–5.1)
Sodium: 140 mmol/L (ref 135–145)
TCO2: 24 mmol/L (ref 0–100)

## 2016-02-09 LAB — I-STAT TROPONIN, ED: Troponin i, poc: 0 ng/mL (ref 0.00–0.08)

## 2016-02-09 LAB — PROTIME-INR
INR: 1.04 (ref 0.00–1.49)
Prothrombin Time: 13.8 seconds (ref 11.6–15.2)

## 2016-02-09 LAB — APTT: aPTT: 30 seconds (ref 24–37)

## 2016-02-09 NOTE — ED Provider Notes (Signed)
CSN: [id number]     Arrival date & time 02/09/16  1336 History   First MD Initiated Contact with Patient 02/09/16 1544     Chief Complaint  Patient presents with  . Hypertension   (Consider location/radiation/quality/duration/timing/severity/associated sxs/prior Treatment) HPI 55 y.o. female with a hx of HTN, MVP, HLD, presents to the Emergency Department today complaining of "feeling foggy" with lightheadedness, trouble articulating words, and her blood pressure being elevated. Notes back aching today while in a meeting and sitting down around 1030. Does not endorse pain. No N/V/D. No Diaphoresis. No fevers. No CP/SOB/ABD pain. No headaches. No visual changes. Pt used to be on BP medication and was taken off due to how the medication made her feel. Pt states that she feels her blood pressure may be high and thinks she should be on a medication. No Hx ACS. No hx DVT/PE. No recent surgeries. No other symptoms noted.    Cardiologist- Dr. Acie Fredrickson  Past Medical History  Diagnosis Date  . GERD (gastroesophageal reflux disease)   . Osteopenia   . Raynaud disease     question false positive antiphospholipid panel  . Vitamin D deficiency   . History of abnormal Pap smear     S/P lazer conization  . Menopause   . Mitral valve prolapse     no report of murmur  . Frozen shoulder     history of bilateral frozen shoulder  . Family history of breast cancer in first degree relative   . Lupus anticoagulant disorder ([place])     refuted by hematologist in 2014  . Anal fissure   . Diverticulosis   . Hypertension   . C. difficile diarrhea     04/2013  . Headache(784.0)   . Constipation     chronic her entire life   Past Surgical History  Procedure Laterality Date  . Knee surgery  2006    left  . Foot surgery  2007    left  . Wisdom tooth extraction  1981   Family History  Problem Relation Age of Onset  . Anemia Father   . Heart disease Father     CHF  . Lung disease Father   . Colon  cancer Mother 80  . Breast cancer Mother   . Breast cancer Maternal Aunt   . Cancer Paternal Aunt     brain  . Heart disease Maternal Grandmother   . Cancer Maternal Grandfather     throat  . Stroke Sister    Social History  Substance Use Topics  . Smoking status: Former Smoker -- 1.00 packs/day for 6 years    Types: Cigarettes    Quit date: 07/02/1991  . Smokeless tobacco: Never Used  . Alcohol Use: Yes     Comment: 1-2 glasses of wine rarely   OB History    No data available     Review of Systems ROS reviewed and all are negative for acute change except as noted in the HPI.  Allergies  Cefuroxime  Home Medications   Prior to Admission medications   Medication Sig Start Date End Date Taking? Authorizing Provider  aspirin EC 81 MG tablet Take 81 mg by mouth 2 (two) times a week.    Historical Provider, MD  atorvastatin (LIPITOR) 20 MG tablet Take 1 tablet (20 mg total) by mouth daily. 12/01/15   Thayer Headings, MD   BP 156/94 mmHg  Pulse 85  Temp(Src) 98.7 F (37.1 C) (Oral)  Resp 17  SpO2  100%   Physical Exam  Constitutional: She is oriented to person, place, and time. She appears well-developed and well-nourished.  HENT:  Head: Normocephalic and atraumatic.  Eyes: EOM are normal. Pupils are equal, round, and reactive to light.  Neck: Normal range of motion. Neck supple. No tracheal deviation present.  Cardiovascular: Normal rate, regular rhythm, normal heart sounds and intact distal pulses.   No murmur heard. Pulmonary/Chest: Effort normal and breath sounds normal. No respiratory distress. She has no wheezes. She has no rales. She exhibits no tenderness.  Abdominal: Soft. There is no tenderness.  Musculoskeletal: Normal range of motion.       Cervical back: Normal.       Thoracic back: Normal.  Neurological: She is alert and oriented to person, place, and time. She has normal strength. No cranial nerve deficit or sensory deficit.  Cranial Nerves:  II:  Pupils equal, round, reactive to light III,IV, VI: ptosis not present, extra-ocular motions intact bilaterally  V,VII: smile symmetric, facial light touch sensation equal VIII: hearing grossly normal bilaterally  IX,X: midline uvula rise  XI: bilateral shoulder shrug equal and strong XII: midline tongue extension  Skin: Skin is warm and dry.  Psychiatric: She has a normal mood and affect. Her behavior is normal. Thought content normal.  Nursing note and vitals reviewed.  ED Course  Procedures (including critical care time) Labs Review Labs Reviewed  PROTIME-INR  APTT  CBC  DIFFERENTIAL  COMPREHENSIVE METABOLIC PANEL  I-STAT [place], ED  I-STAT CHEM 8, ED  CBG MONITORING, ED   Imaging Review Ct Head Wo Contrast  02/09/2016  CLINICAL DATA:  Dysarthria for 1 day. Altered mental status. Dizziness. Hypertension. EXAM: CT HEAD WITHOUT CONTRAST TECHNIQUE: Contiguous axial images were obtained from the base of the skull through the vertex without intravenous contrast. COMPARISON:  None. FINDINGS: The ventricles are normal in size and configuration. There is no intracranial mass, hemorrhage, extra-axial fluid collection, or midline shift. Gray-white compartments are normal. No acute infarct is evident. The bony calvarium appears intact. Visualized mastoid air cells are clear. Orbits appear symmetric bilaterally. There is leftward deviation of the posterior nasal septum with a bony spur toward the left. IMPRESSION: No intracranial mass, hemorrhage, or focal gray -white compartment lesion/acute appearing infarct. Deviated nasal septum. Electronically Signed   By: Lowella Grip III M.D.   On: 02/09/2016 14:30   I have personally reviewed and evaluated these images and lab results as part of my medical decision-making.   EKG Interpretation   Date/Time:  Tuesday February 09 2016 13:48:11 EDT Ventricular Rate:  76 PR Interval:  142 QRS Duration: 86 QT Interval:  408 QTC Calculation: 459 R  Axis:   75 Text Interpretation:  Normal sinus rhythm Biatrial enlargement Left  ventricular hypertrophy Nonspecific ST abnormality Abnormal ECG LVH is new  since last tracing. Confirmed by Alvino Chapel  MD, Ovid Curd [phone]) on  02/09/2016 3:40:33 PM      MDM  I have reviewed and evaluated the relevant laboratory values. I have reviewed and evaluated the relevant imaging studies. I have interpreted the relevant EKG. I have reviewed the relevant previous healthcare records. I obtained HPI from historian. Patient discussed with supervising physician  ED Course:  Assessment: Pt is a 46yF with hx HTN, MVP, HLD who presents with lightheadedness and back discomfort this AM while in a meeting at work. No hx ACS. No hx DVT/PE. On exam, pt in NAD. Nontoxic/nonseptic appearing. VSS. Afebrile. Lungs CTA. Heart RRR. Abdomen nontender soft. Distal  pulses equal bilaterally. CN evaluated and unremarkable. Labs unremarkable. Trop neg. EKG with no acute abnormalities. CT Head unremarkable. No acute pathology identified at this time. Heart Score 2. Symptoms likely representative of uncontrolled HTN. Plan is to follow up with Dr. Acie Fredrickson for possible BP management. At time of discharge, Patient is in no acute distress. Vital Signs are stable. Patient is able to ambulate. Patient able to tolerate PO.    Disposition/Plan:  DC Home Additional Verbal discharge instructions given and discussed with patient.  Pt Instructed to f/u with [facility] in the next week for evaluation and treatment of symptoms. Return precautions given Pt acknowledges and agrees with plan  Supervising Physician Davonna Belling, MD   Final diagnoses:  Essential hypertension     Shary Decamp, PA-C 02/09/16 1634  Davonna Belling, MD 02/09/16 2325

## 2016-02-09 NOTE — Discharge Instructions (Signed)
Please read and follow all provided instructions.  Your diagnoses today include:  1. Essential hypertension    Tests performed today include:  Vital signs. See below for your results today.   Medications prescribed:   Take as prescribed   Home care instructions:  Follow any educational materials contained in this packet.  Follow-up instructions: Please follow-up with Dr. Acie Fredrickson for further evaluation of symptoms and treatment   Return instructions:   Please return to the Emergency Department if you do not get better, if you get worse, or new symptoms OR  - Fever (temperature greater than 101.63F)  - Bleeding that does not stop with holding pressure to the area    -Severe pain (please note that you may be more sore the day after your accident)  - Chest Pain  - Difficulty breathing  - Severe nausea or vomiting  - Inability to tolerate food and liquids  - Passing out  - Skin becoming red around your wounds  - Change in mental status (confusion or lethargy)  - New numbness or weakness     Please return if you have any other emergent concerns.  Additional Information:  Your vital signs today were: BP 156/94 mmHg   Pulse 85   Temp(Src) 98.7 F (37.1 C) (Oral)   Resp 17   SpO2 100% If your blood pressure (BP) was elevated above 135/85 this visit, please have this repeated by your doctor within one month. ---------------

## 2016-02-09 NOTE — ED Notes (Addendum)
She c/o 1 day history of "feeling foggy", lightheadeness, trouble articulating her words, and high blood pressure. She began to have back pain today. She is alert and her speech is easily understood now, she denies any pain or weakness.

## 2016-02-09 NOTE — ED Notes (Signed)
Pt placed on cardiac monitor and in gown on arrival to room A13.

## 2016-02-09 NOTE — ED Notes (Addendum)
Pt sees Dr. Acie Fredrickson for MVP -- was started on a BP med 2 years ago, but did not take for very long. States feels "foggy " on right side of head-- has had headaches, waas seen at urgent care today for viral symptoms. Did has speech irregularities yesterday- thought it was related to stress and lack of sleep.

## 2016-02-09 NOTE — Telephone Encounter (Signed)
New message    Richardson Landry calling - aware that Dr. Acie Fredrickson nurse is off today.     Patient in the emergency room at [facility] now - wants to speak with nurse. Did not wants to go into details.

## 2016-02-09 NOTE — Telephone Encounter (Signed)
Spoke with pt's husband. He reports pt went to urgent care and blood pressure was elevated. She was sent to ED and is currently there. CT scan to be done.  He wanted to make Dr. Acie Fredrickson aware.  I told husband Dr.Nahser is off today and that I would forward the message to him.

## 2016-02-09 NOTE — Telephone Encounter (Signed)
Left message to call back  

## 2016-02-10 NOTE — Telephone Encounter (Signed)
BP is slightly elevated while in the ER. She can be worked in to see a PA this week or next Or work her in if I have an open slot

## 2016-02-10 NOTE — Telephone Encounter (Signed)
I spoke with pt and appt made for her to see Katrina Copa, PA on June 26,2017 at 8:30

## 2016-02-19 ENCOUNTER — Encounter: Payer: Self-pay | Admitting: Physician Assistant

## 2016-02-19 NOTE — Progress Notes (Signed)
Cardiology Office Note    Date:  02/22/2016  ID:  Katrina [name], DOB 1960/10/01, MRN [id number] PCP:  Lucretia Kern., DO  Cardiologist:  Nahser  Chief Complaint: high blood pressure  History of Present Illness:  Katrina [name] is a 55 y.o. female with history of HTN, hyperlipidemia, mitral valve prolapse, GERD, Raynaud's disease, chronic constipation who presents for evaluation of blood pressure.   Per chart she has a history of Raynaud's disease. Per PCP note from 2010, she was positive lupus anticoagulant and antiphosopholipid but apparently this was later refuted by bloodwork and her finger discoloration was felt due to Raynaud's rather than embolic in nature. She was on anticoagulation for a period of time. Remote echo 01/2009 showed EF 123456, systolic bowing of mitral valve without prolapse.  She was recently seen in the ER 02/09/16 after feeling "foggy" with lightheadedness, trouble articulating words, and elevated blood pressure. She used to be on blood pressure medicine and was taken off due to how the medication made her feel. BP was 156/94 in the ER. Neuro exam was normal. CT head was negative. Symptoms were felt likely due to high blood pressure. CBC, CMET, troponin unremarkable. Last LDL in 06/2015 was 147.   She comes in today for follow-up feeling better. She admits to being under a lot of stress lately. 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 She has not had any chest pain, SOB, or LEE. She exercises regularly without reported complaint. She has been following her BP at home and it has been running 120s/70s-80s. She has had a few Raynaud's episodes.  Past Medical History  Diagnosis Date  . GERD (gastroesophageal reflux disease)   . Osteopenia   . Raynaud disease     question false positive antiphospholipid panel  . Vitamin D deficiency   . History of abnormal Pap smear     S/P lazer conization  . Menopause   .  Mitral valve prolapse     a. echo 01/2009 showed EF 123456, systolic bowing of mitral valve without prolapse.   . Frozen shoulder     history of bilateral frozen shoulder  . Family history of breast cancer in first degree relative   . Lupus anticoagulant disorder ([place])     refuted by hematologist in 2014  . Anal fissure   . Diverticulosis   . Hypertension   . C. difficile diarrhea     04/2013  . Headache(784.0)   . Constipation     chronic her entire life    Past Surgical History  Procedure Laterality Date  . Knee surgery  2006    left  . Foot surgery  2007    left  . Wisdom tooth extraction  1981    Current Medications: Current Outpatient Prescriptions  Medication Sig Dispense Refill  . aspirin EC 81 MG tablet Take 81 mg by mouth daily.     Marland Kitchen atorvastatin (LIPITOR) 20 MG tablet Take 1 tablet (20 mg total) by mouth daily. 30 tablet 11  . cholecalciferol (VITAMIN D) 1000 units tablet Take 1,000 Units by mouth daily.    . diclofenac sodium (VOLTAREN) 1 % GEL Apply 2 g topically as needed (KNEEE PAIN).   0  . TURMERIC PO Take 1 capsule by mouth daily.     No current facility-administered medications for this visit.     Allergies:   Cefuroxime   Social History   Social History  . Marital Status: Married  Spouse Name: N/A  . Number of Children: 3  . Years of Education: N/A   Occupational History  . architect Other  .     Social History Main Topics  . Smoking status: Former Smoker -- 1.00 packs/day for 6 years    Types: Cigarettes    Quit date: 07/02/1991  . Smokeless tobacco: Never Used  . Alcohol Use: Yes     Comment: 1-2 glasses of wine rarely  . Drug Use: No  . Sexual Activity:    Partners: Male   Other Topics Concern  . 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 There is no history of Heart attack.   ROS:   Please see the history of present illness. Otherwise, review of systems is positive for a gradual rise in her weight. All other systems are reviewed and otherwise negative.    PHYSICAL EXAM:   VS:  BP 130/90 mmHg  Pulse 80  Ht 5' 5.5" (1.664 m)  Wt 141 lb (63.957 kg)  BMI 23.10 kg/m2  BMI: Body mass index is 23.1 kg/(m^2). GEN: Well nourished, well developed WF, in no acute distress HEENT: normocephalic, atraumatic Neck: no JVD, carotid bruits, or masses Cardiac: RRR; no murmurs, rubs, or gallops, no edema, no click Respiratory:  clear to auscultation bilaterally, normal work of breathing GI: soft, nontender, nondistended, + BS MS: no deformity or atrophy Skin: warm and dry, no rash Neuro:  Alert and Oriented x 3, Strength and sensation are intact, follows commands Psych: euthymic mood, full affect  Wt Readings from Last 3 Encounters:  02/22/16 141 lb (63.957 kg)  12/01/15 140 lb 9.6 oz (63.776 kg)  07/14/15 136 lb 11.2 oz (62.007 kg)      Studies/Labs Reviewed:   EKG:   EKG was not ordered today. Reviewed EKG from 6/13 - NSR LVH with nonspecific ST-T changes, but appears similar to prior.  Recent Labs: 02/09/2016: ALT 21; BUN 10; Creatinine, Ser 0.70; Hemoglobin 14.6; Platelets 243; Potassium 3.5; Sodium 140   Lipid Panel    Component Value Date/Time   CHOL 265* 07/14/2015 1214   TRIG 68.0 07/14/2015 1214   HDL  104.40 07/14/2015 1214   CHOLHDL 3 07/14/2015 1214   VLDL 13.6 07/14/2015 1214   LDLCALC 147* 07/14/2015 1214   LDLDIRECT 144.4 12/15/2010 0838    Additional studies/ records that were reviewed today include: Summarized above.    ASSESSMENT & PLAN:   1. HTN - she reports recent issues with intermittent BP spikes for which she feels she is symptomatic. BP 130/90 in clinic. Will check thyroid function given  reports of gradual weight gain. Add amlodipine 2.5mg  daily which may also help with her Raynaud's disease. She will continue to trend BP at home. 2. Raynaud phenomena - add amlodipine as above. 3. Hyperlipidemia - last lipids 06/2015. Pending repeat in July. Offered to draw today since we are getting TSH anyway - she would like to go ahead with this. Recent CMET was normal in case statin titration is needed. She had labs scheduled for 7/11 and if her lipid panel is satisfactory we will let her know she can cancel these. 4. H/o mitral valve prolapse - not seen on echo 2010 (bowing only)  Disposition: F/u with Dr. Acie Fredrickson in 6 months to reassess BP control at that time.   Medication Adjustments/Labs and Tests Ordered: Current medicines are reviewed at length with the patient today.  Concerns regarding medicines are outlined above. Medication changes, Labs and Tests ordered today are summarized above and listed in the Patient Instructions accessible in Encounters.   Raechel Ache PA-C  02/22/2016 8:57 AM    [address], [place], [place]  [address] Phone: [phone]; Fax: [phone]

## 2016-02-22 ENCOUNTER — Encounter: Payer: Self-pay | Admitting: Physician Assistant

## 2016-02-22 ENCOUNTER — Ambulatory Visit (INDEPENDENT_AMBULATORY_CARE_PROVIDER_SITE_OTHER): Payer: BLUE CROSS/BLUE SHIELD | Admitting: Physician Assistant

## 2016-02-22 VITALS — BP 130/90 | HR 80 | Ht 65.5 in | Wt 141.0 lb

## 2016-02-22 DIAGNOSIS — I341 Nonrheumatic mitral (valve) prolapse: Secondary | ICD-10-CM

## 2016-02-22 DIAGNOSIS — I10 Essential (primary) hypertension: Secondary | ICD-10-CM

## 2016-02-22 DIAGNOSIS — I7300 Raynaud's syndrome without gangrene: Secondary | ICD-10-CM

## 2016-02-22 DIAGNOSIS — E785 Hyperlipidemia, unspecified: Secondary | ICD-10-CM

## 2016-02-22 DIAGNOSIS — R42 Dizziness and giddiness: Secondary | ICD-10-CM

## 2016-02-22 LAB — LIPID PANEL
Cholesterol: 220 mg/dL — ABNORMAL HIGH (ref 125–200)
HDL: 106 mg/dL (ref >=46)
LDL Cholesterol: 101 mg/dL (ref <130)
Total CHOL/HDL Ratio: 2.1 Ratio (ref <=5.0)
Triglycerides: 63 mg/dL (ref <150)
VLDL: 13 mg/dL (ref <30)

## 2016-02-22 LAB — TSH: TSH: 2.3 mIU/L

## 2016-02-22 NOTE — Patient Instructions (Signed)
Medication Instructions:  Your physician recommends that you continue on your current medications as directed. Please refer to the Current Medication list given to you today.   Labwork: TODAY;  LIPID & TSH  Testing/Procedures: None ordered  Follow-Up: Your physician wants you to follow-up in: [place] DR. Acie Fredrickson  You will receive a reminder letter in the mail two months in advance. If you don't receive a letter, please call our office to schedule the follow-up appointment.   Any Other Special Instructions Will Be Listed Below (If Applicable).     If you need a refill on your cardiac medications before your next appointment, please call your pharmacy.

## 2016-02-25 ENCOUNTER — Telehealth: Payer: Self-pay | Admitting: *Deleted

## 2016-02-25 ENCOUNTER — Other Ambulatory Visit: Payer: Self-pay | Admitting: *Deleted

## 2016-02-25 MED ORDER — AMLODIPINE BESYLATE 2.5 MG PO TABS: 2.5000 mg | ORAL_TABLET | Freq: Every day | ORAL | Status: DC

## 2016-02-25 NOTE — Telephone Encounter (Signed)
Patient left a msg on the refill vm stating that she saw Melina Copa this week and was told that she was to start taking a blood pressure medication. She has been to the pharmacy several times and they do not have anything for her. She would like this sent to [facility] on [address]. She can be reached at [phone]. Thanks, MI

## 2016-02-25 NOTE — Addendum Note (Signed)
Addended by: Gaetano Net on: 02/25/2016 12:46 PM   Modules accepted: Orders

## 2016-03-08 ENCOUNTER — Other Ambulatory Visit: Payer: BLUE CROSS/BLUE SHIELD

## 2016-04-28 ENCOUNTER — Telehealth: Payer: Self-pay | Admitting: Gastroenterology

## 2016-04-28 NOTE — Telephone Encounter (Signed)
Patient reports blood in her stool this am.  She reports it is bright red.  She has had some changes in her stools lately and has mucus.  She will come in and see Nicoletta Ba PA on 05/04/16 2:00

## 2016-05-04 ENCOUNTER — Ambulatory Visit: Payer: BLUE CROSS/BLUE SHIELD | Admitting: Physician Assistant

## 2016-06-14 DIAGNOSIS — R300 Dysuria: Secondary | ICD-10-CM | POA: Diagnosis not present

## 2016-07-06 ENCOUNTER — Ambulatory Visit: Payer: BLUE CROSS/BLUE SHIELD | Admitting: Gastroenterology

## 2016-07-06 ENCOUNTER — Encounter: Payer: Self-pay | Admitting: Physician Assistant

## 2016-07-06 NOTE — Progress Notes (Addendum)
Cardiology Office Note    Date:  07/07/2016  ID:  Katrina [name], DOB 03/25/1961, MRN [id number] PCP:  Lucretia Kern., DO  Cardiologist:  Nahser   Chief Complaint: blood pressure issues  History of Present Illness:  Katrina [name] is a 55 y.o. female with history of HTN, hyperlipidemia, mitral valve prolapse, GERD, Raynaud's disease, chronic constipation who presents for evaluation of blood pressure.   Per chart she has a history of Raynaud's disease. Per PCP note from 2010, she was positive lupus anticoagulant and antiphosopholipid but apparently this was later refuted by bloodwork and her finger discoloration was felt due to Raynaud's rather than embolic in nature. She was on anticoagulation for a period of time. Remote echo 01/2009 showed EF 123456, systolic bowing of mitral valve without prolapse.  She was recently seen in the ER 02/09/16 after feeling "foggy" with lightheadedness, trouble articulating words, and elevated blood pressure. She used to be on blood pressure medicine and was taken off due to how the medication made her feel. BP was 156/94 in the ER. Neuro exam was normal. CT head was negative. Symptoms were felt likely due to high blood pressure. CBC, CMET, troponin unremarkable. At follow-up 02/22/16, we added low dose amlodipine to assist with her blood pressure spikes. LDL was 101, down from 147 previously.  She returns for follow-up of her blood pressure. Had recent issues with leg swelling while she was in [place] at the end of long days walking. This has since resolved. She notes her BP occasionally going up into the 140 range. She seems to be sensitive to feeling when her blood pressure is up - feels somewhat lightheaded, feels stress rising. No CP or SOB. Regularly active. She also notes she stopped her atorvastatin but isn't sure why. The amlodipine has helped her Raynaud's. She also notes mild weight gain - weight up 2lb in our system in 6  months.    Past Medical History:  Diagnosis Date  . Anal fissure   . C. difficile diarrhea    04/2013  . Constipation    chronic her entire life  . Diverticulosis   . Family history of breast cancer in first degree relative   . Frozen shoulder    history of bilateral frozen shoulder  . GERD (gastroesophageal reflux disease)   . Headache(784.0)   . History of abnormal Pap smear    S/P lazer conization  . Hyperlipidemia   . Hypertension   . Lupus anticoagulant disorder ([place])    refuted by hematologist in 2014  . Menopause   . Mitral valve prolapse    a. echo 01/2009 showed EF 123456, systolic bowing of mitral valve without prolapse.   . Osteopenia   . Raynaud disease    a. question false positive antiphospholipid panel-> sx later felt due to Raynaud itself (transient finger discoloration) rather than embolic phenomena.  . Vitamin D deficiency     Past Surgical History:  Procedure Laterality Date  . FOOT SURGERY  2007   left  . KNEE SURGERY  2006   left  . WISDOM TOOTH EXTRACTION  1981    Current Medications: Current Outpatient Prescriptions  Medication Sig Dispense Refill  . amLODipine (NORVASC) 2.5 MG tablet Take 1 tablet (2.5 mg total) by mouth daily. 90 tablet 3  . aspirin EC 81 MG tablet Take 81 mg by mouth as needed.     . cholecalciferol (VITAMIN D) 1000 units tablet Take 1,000 Units by mouth daily.    Marland Kitchen  diclofenac sodium (VOLTAREN) 1 % GEL Apply 2 g topically as needed (KNEEE PAIN).   0  . TURMERIC PO Take 1 capsule by mouth daily.     No current facility-administered medications for this visit.      Allergies:   Cefuroxime   Social History   Social History  . Marital status: Married    Spouse name: N/A  . Number of children: 3  . Years of education: N/A   Occupational History  . architect Other  .  [employer]   Social History Main Topics  . Smoking status: Former Smoker    Packs/day: 1.00    Years: 6.00    Types: Cigarettes    Quit  date: 07/02/1991  . Smokeless tobacco: Never Used  . Alcohol use Yes     Comment: 1-2 glasses of wine rarely  . Drug use: No  . Sexual activity: Yes    Partners: Male   Other Topics Concern  . 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   ROS:   Please see the history of present illness.  All other systems are reviewed and otherwise negative.    PHYSICAL EXAM:   VS:  BP 130/82 (BP Location: Left Arm, Patient Position: Sitting, Cuff Size: Normal)   Pulse 72   Ht 5' 5.5" (1.664 m)   Wt 142 lb 8 oz (64.6 kg)   BMI 23.35 kg/m   BMI: Body mass index is 23.35 kg/m. GEN: Well nourished, well developed WF, in no acute distress. Fit appearing HEENT: normocephalic, atraumatic Neck: no JVD, carotid bruits, or masses Cardiac: RRR; no murmurs, rubs, or gallops, no edema  Respiratory:  clear to auscultation bilaterally, normal work of breathing GI: soft, nontender, nondistended, + BS MS: no deformity or atrophy  Skin: warm and dry, no rash Neuro:  Alert and Oriented x 3, Strength and sensation are intact, follows commands Psych: euthymic mood, full affect  Wt Readings from Last 3 Encounters:  07/07/16 142 lb 8 oz (64.6 kg)  02/22/16 141 lb (64 kg)  12/01/15 140 lb 9.6 oz (63.8 kg)       Studies/Labs Reviewed:   EKG:  EKG was not ordered today.  Recent Labs: 02/09/2016: ALT 21; BUN 10; Creatinine, Ser 0.70; Hemoglobin 14.6; Platelets 243; Potassium 3.5; Sodium 140 02/22/2016: TSH 2.30   Lipid Panel    Component Value Date/Time   CHOL 220 (H) 02/22/2016 0919   TRIG 63 02/22/2016 0919   HDL 106 02/22/2016 0919   CHOLHDL 2.1 02/22/2016 0919   VLDL 13 02/22/2016 0919   LDLCALC 101 02/22/2016  [id number]   LDLDIRECT 144.4 12/15/2010 0838    Additional studies/ records that were reviewed today include: Summarized above.    ASSESSMENT & PLAN:   1. HTN - she has been experiencing some continued BP spikes. She has had some dependent LEE which I suspect was a side effect of amlodipine in the setting of being on her feet all day, but I do not want to stop this since it has helped her Raynaud's. I don't want to exacerbate her swelling by increasing it, so will add low dose diuretic therapy with HCTZ 12.5mg  daily. Her potassium was 3.5-3.9 in the past so will also add KCl 66meq daily. No signs of DVT on exam. Her LEE has resolved. 2. Hyperlipidemia - she stopped her atorvastatin but is unsure why. She is willing to restart. Will order f/u lipids/CMET in 6 weeks.  3. MVP - not seen on echo in 2010. If she continues to have fluctuating sx, would consider updating her echocardiogram. 4. Raynaud's disease - improved with low dose amlodipine.  Disposition: F/u with me in 1 week with BMET.   Medication Adjustments/Labs and Tests Ordered: Current medicines are reviewed at length with the patient today.  Concerns regarding medicines are outlined above. Medication changes, Labs and Tests ordered today are summarized above and listed in the Patient Instructions accessible in Encounters.   Raechel Ache PA-C  07/07/2016 9:45 AM    [facility] [address], [place], [place]  [address] Phone: [phone]; Fax: [phone]

## 2016-07-07 ENCOUNTER — Ambulatory Visit (INDEPENDENT_AMBULATORY_CARE_PROVIDER_SITE_OTHER): Payer: BLUE CROSS/BLUE SHIELD | Admitting: Physician Assistant

## 2016-07-07 ENCOUNTER — Encounter: Payer: Self-pay | Admitting: Physician Assistant

## 2016-07-07 VITALS — BP 130/82 | HR 72 | Ht 65.5 in | Wt 142.5 lb

## 2016-07-07 DIAGNOSIS — I341 Nonrheumatic mitral (valve) prolapse: Secondary | ICD-10-CM

## 2016-07-07 DIAGNOSIS — E785 Hyperlipidemia, unspecified: Secondary | ICD-10-CM

## 2016-07-07 DIAGNOSIS — I10 Essential (primary) hypertension: Secondary | ICD-10-CM

## 2016-07-07 DIAGNOSIS — I7300 Raynaud's syndrome without gangrene: Secondary | ICD-10-CM | POA: Diagnosis not present

## 2016-07-07 MED ORDER — POTASSIUM CHLORIDE CRYS ER 20 MEQ PO TBCR: 20.0000 meq | EXTENDED_RELEASE_TABLET | Freq: Every day | ORAL | 0 refills | Status: DC

## 2016-07-07 MED ORDER — ATORVASTATIN CALCIUM 20 MG PO TABS: 20.0000 mg | ORAL_TABLET | Freq: Every day | ORAL | 1 refills | Status: DC

## 2016-07-07 MED ORDER — HYDROCHLOROTHIAZIDE 12.5 MG PO CAPS: 12.5000 mg | ORAL_CAPSULE | Freq: Every day | ORAL | 0 refills | Status: DC

## 2016-07-07 NOTE — Patient Instructions (Addendum)
Medication Instructions:  Your physician has recommended you make the following change in your medication:  1.  START Hydrochlorothiazide 12.5 mg taking 1 tablet daily 2.  START Potassium 20 meq taking 1 tablet daily 3.  RESTART Atorvastatin 20 mg taking 1 tablet daily   Labwork: 1 WEEK (AT TIME OF F/U APPT) BMET   6 WEEKS:  FASTING LIPID & LFT  Testing/Procedures: None ordered  Follow-Up: Your physician recommends that you schedule a follow-up appointment in: [place], PA-C (LAB WORK SAME DAY)   Any Other Special Instructions Will Be Listed Below (If Applicable).     If you need a refill on your cardiac medications before your next appointment, please call your pharmacy.

## 2016-07-12 DIAGNOSIS — I7300 Raynaud's syndrome without gangrene: Secondary | ICD-10-CM | POA: Insufficient documentation

## 2016-07-12 NOTE — Progress Notes (Signed)
Cardiology Office Note    Date:  07/14/2016  ID:  Katrina [name], DOB [birth year], MRN [id number] PCP:  Lucretia Kern., DO  Cardiologist:  Nahser   Chief Complaint: f/u BP  History of Present Illness:  Katrina [name] is a 55 y.o. female with history of HTN, hyperlipidemia, mitral valve prolapse, GERD, Raynaud's disease, chronic constipation who presents for evaluation of blood pressure.   Per chart she has a history of Raynaud's disease. Per PCP note from 2010, she was positive lupus anticoagulant and antiphosopholipid but apparently this was later refuted by bloodwork and her finger discoloration was felt due to Raynaud's rather than embolic in nature. She was on anticoagulation for a period of time. Remote echo 01/2009 showed EF 123456, systolic bowing of mitral valve without prolapse.  She was recently seen in the ER 02/09/16 after feeling "foggy" with lightheadedness, trouble articulating words, and elevated blood pressure. She used to be on blood pressure medicine and was taken off due to how the medication made her feel. BP was 156/94 in the ER. Neuro exam was normal. CT head was negative. Symptoms were felt likely due to high blood pressure. CBC, CMET, troponin unremarkable. At follow-up 02/22/16, we added low dose amlodipine to assist with her blood pressure spikes. LDL was 101, down from 147 previously.  I saw her back in clinic last week for f/u of BP. The amlodipine has helped her Raynaud's. However, she's had recent issues with leg swelling while she was in [place] at the end of long days walking. This has since resolved. She has since noted her BP occasionally going up into the 140 range. She seems to be sensitive to feeling when her blood pressure is up - feels somewhat lightheaded, feels stress rising. No CP or SOB. Regularly active. She also noted mild weight gain, up 2lb in 6 months per Epic. At her OV on 07/07/16 we added low dose HCTZ with potassium. Amlodipine  was continued since it had helped her Raynaud's. She was also advised to r/s her atorvastatin (she did not recall why she stopped taking it).  She presents back for follow-up today. Her blood pressure is running slightly higher this morning - 140/80 with recheck 145/89. She took her medication about 2 hours ago, but just started the HCTZ on 07/11/16. She has been following this at home. Most recent reading otherwise was 132/84. No CP or SOB.  Past Medical History:  Diagnosis Date  . Anal fissure   . C. difficile diarrhea    04/2013  . Constipation    chronic her entire life  . Diverticulosis   . Family history of breast cancer in first degree relative   . Frozen shoulder    history of bilateral frozen shoulder  . GERD (gastroesophageal reflux disease)   . Headache(784.0)   . History of abnormal Pap smear    S/P lazer conization  . Hyperlipidemia   . Hypertension   . Lupus anticoagulant disorder ([place])    refuted by hematologist in 2014  . Menopause   . Mitral valve prolapse    a. echo 01/2009 showed EF 123456, systolic bowing of mitral valve without prolapse.   . Osteopenia   . Raynaud disease    a. question false positive antiphospholipid panel-> sx later felt due to Raynaud itself (transient finger discoloration) rather than embolic phenomena.  . Vitamin D deficiency     Past Surgical History:  Procedure Laterality Date  . FOOT SURGERY  2007  left  . KNEE SURGERY  2006   left  . WISDOM TOOTH EXTRACTION  1981    Current Medications: Current Outpatient Prescriptions  Medication Sig Dispense Refill  . amLODipine (NORVASC) 2.5 MG tablet Take 1 tablet (2.5 mg total) by mouth daily. 90 tablet 3  . aspirin EC 81 MG tablet Take 81 mg by mouth as needed.     Marland Kitchen atorvastatin (LIPITOR) 20 MG tablet Take 1 tablet (20 mg total) by mouth daily. 30 tablet 1  . cholecalciferol (VITAMIN D) 1000 units tablet Take 1,000 Units by mouth daily.    . diclofenac sodium (VOLTAREN) 1 % GEL Apply  2 g topically as needed (KNEEE PAIN).   0  . hydrochlorothiazide (MICROZIDE) 12.5 MG capsule Take 1 capsule (12.5 mg total) by mouth daily. 30 capsule 0  . potassium chloride SA (KLOR-CON M20) 20 MEQ tablet Take 1 tablet (20 mEq total) by mouth daily. 30 tablet 0  . TURMERIC PO Take 1 capsule by mouth daily.     No current facility-administered medications for this visit.      Allergies:   Cefuroxime   Social History   Social History  . Marital status: Married    Spouse name: N/A  . Number of children: 3  . Years of education: N/A   Occupational History  . architect Other  .  [employer]   Social History Main Topics  . Smoking status: Former Smoker    Packs/day: 1.00    Years: 6.00    Types: Cigarettes    Quit date: 07/02/1991  . Smokeless tobacco: Never Used  . Alcohol use Yes     Comment: 1-2 glasses of wine rarely  . Drug use: No  . Sexual activity: Yes    Partners: Male   Other Topics Concern  . 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   ROS:   Please see the history of present illness.  All other systems are reviewed and otherwise negative.    PHYSICAL EXAM:   VS:  BP 140/80   Pulse 76   Ht 5\' 6"  (1.676 m)   Wt 135 lb 2.7 oz (61.3 kg)   BMI 21.82 kg/m    BMI: Body mass index is 21.82 kg/m. GEN: Well nourished, well developed WF in no acute distress  HEENT: normocephalic, atraumatic Neck: no JVD, carotid bruits, or masses Cardiac: RRR; no murmurs, rubs, or gallops, no edema  Respiratory:  clear to auscultation bilaterally, normal work of breathing GI: soft, nontender, nondistended, + BS MS: no deformity or atrophy  Skin: warm and dry, no  rash Neuro:  Alert and Oriented x 3, Strength and sensation are intact, follows commands Psych: euthymic mood, full affect  Wt Readings from Last 3 Encounters:  07/14/16 135 lb 2.7 oz (61.3 kg)  07/07/16 142 lb 8 oz (64.6 kg)  02/22/16 141 lb (64 kg)      Studies/Labs Reviewed:   EKG: EKG was not ordered today.  Recent Labs: 02/09/2016: ALT 21; BUN 10; Creatinine, Ser 0.70; Hemoglobin 14.6; Platelets 243; Potassium 3.5; Sodium 140 02/22/2016: TSH 2.30   Lipid Panel    Component Value Date/Time   CHOL 220 (H) 02/22/2016 0919   TRIG 63 02/22/2016 0919   HDL 106 02/22/2016 0919   CHOLHDL 2.1 02/22/2016 0919   VLDL 13 02/22/2016 0919   LDLCALC 101 02/22/2016 0919   LDLDIRECT 144.4 12/15/2010 0838    Additional studies/ records that were reviewed today include: Summarized above.    ASSESSMENT & PLAN:   1. Essential HTN - BP remains slightly elevated. She had some dependent edema a few weeks ago, so I did not want to increase her amlodipine. I suspect she needs increase of her HCTZ to 25mg  daily but she states she just started this a few days ago, so will continue this dose for now and have her follow her BP daily over the next few days. I have asked her to call in with readings on Monday/Tuesday. Goal SBP for her would be 0000000 systolic. Recheck BMET today. 2. Hyperlipidemia - she recently resumed statin. Plan to recheck lipids/liver in 07/2016 as scheduled. 3. MVP - not seen on 2010 echo. Continue to follow clinically. 4. Raynaud's disease - improved on amlodipine.  Disposition: F/u  with Dr. Acie Fredrickson in 09/2016.    Medication Adjustments/Labs and Tests Ordered: Current medicines are reviewed at length with the patient today.  Concerns regarding medicines are outlined above. Medication changes, Labs and Tests ordered today are summarized above and listed in the Patient Instructions accessible in Encounters.   Raechel Ache PA-C  07/14/2016 9:38 AM    [address], [place], [place]  [address] Phone: [phone]; Fax: [phone]

## 2016-07-14 ENCOUNTER — Other Ambulatory Visit: Payer: BLUE CROSS/BLUE SHIELD

## 2016-07-14 ENCOUNTER — Encounter: Payer: Self-pay | Admitting: Physician Assistant

## 2016-07-14 ENCOUNTER — Ambulatory Visit (INDEPENDENT_AMBULATORY_CARE_PROVIDER_SITE_OTHER): Payer: BLUE CROSS/BLUE SHIELD | Admitting: Physician Assistant

## 2016-07-14 VITALS — BP 140/80 | HR 76 | Ht 66.0 in | Wt 135.2 lb

## 2016-07-14 DIAGNOSIS — E785 Hyperlipidemia, unspecified: Secondary | ICD-10-CM

## 2016-07-14 DIAGNOSIS — I7300 Raynaud's syndrome without gangrene: Secondary | ICD-10-CM

## 2016-07-14 DIAGNOSIS — I341 Nonrheumatic mitral (valve) prolapse: Secondary | ICD-10-CM | POA: Diagnosis not present

## 2016-07-14 DIAGNOSIS — I10 Essential (primary) hypertension: Secondary | ICD-10-CM

## 2016-07-14 LAB — BASIC METABOLIC PANEL
BUN: 12 mg/dL (ref 7–25)
CO2: 28 mmol/L (ref 20–31)
Calcium: 10 mg/dL (ref 8.6–10.4)
Chloride: 96 mmol/L — ABNORMAL LOW (ref 98–110)
Creat: 0.77 mg/dL (ref 0.50–1.05)
Glucose, Bld: 100 mg/dL — ABNORMAL HIGH (ref 65–99)
Potassium: 3.9 mmol/L (ref 3.5–5.3)
Sodium: 135 mmol/L (ref 135–146)

## 2016-07-14 NOTE — Patient Instructions (Addendum)
Medication Instructions:  Your physician recommends that you continue on your current medications as directed. Please refer to the Current Medication list given to you today.   Labwork: BMET today  Testing/Procedures: None ordered  Follow-Up: Your physician recommends that you schedule a follow-up appointment in: 09/2016 with Dr. Acie Fredrickson. Keep follow-up labs as planned in December.   Any Other Special Instructions Will Be Listed Below (If Applicable). Please check your blood pressure regularly over the next several days, at least 2 hours after you've taken your blood pressure medication. You can check it at different time points in the day so we can see what the average reading is running. Please call in on Monday/Tuesday with the result. Our goal for your blood pressure is ideally less than 130-135 on the top number (systolic).     If you need a refill on your cardiac medications before your next appointment, please call your pharmacy.

## 2016-07-14 NOTE — Addendum Note (Signed)
Addended by: Eulis Foster on: 07/14/2016 10:05 AM   Modules accepted: Orders

## 2016-07-15 ENCOUNTER — Encounter: Payer: BLUE CROSS/BLUE SHIELD | Admitting: Family Medicine

## 2016-08-10 ENCOUNTER — Other Ambulatory Visit: Payer: Self-pay | Admitting: Physician Assistant

## 2016-08-16 ENCOUNTER — Telehealth: Payer: Self-pay | Admitting: Cardiovascular Disease

## 2016-08-16 DIAGNOSIS — Z79899 Other long term (current) drug therapy: Secondary | ICD-10-CM

## 2016-08-16 MED ORDER — ATORVASTATIN CALCIUM 20 MG PO TABS: 10.0000 mg | ORAL_TABLET | Freq: Every day | ORAL | 0 refills | Status: DC

## 2016-08-16 NOTE — Telephone Encounter (Signed)
Pt has been advised that her myalgias are not likely from Lipitor since she had stopped it X's 5 days, and it has been recommended she start it back at 10 mg daily.  Pt will have CMET & Lipid 08/18/16 and has been made aware that we will contact her when we get those results back.  Pt agreeable with this plan and verbalized understanding.  Med changes and Lab orders are in EPIC.

## 2016-08-16 NOTE — Telephone Encounter (Signed)
If she stopped the Lipitor and did not notice improvement in her symptoms, then her myalgias are likely not related to the statin. Could try cutting the pill in half and see if she tolerates 10mg  daily. HCTZ typically does not cause leg pain. We can recheck K+ when she has labs this week, as HCTZ could cause hypokalemia (make LFT into CMET so this will include BMET and LFT's). Thanks.

## 2016-08-16 NOTE — Telephone Encounter (Signed)
New message  Pt wants to discuss side effects of Lipitor 20mg   Having pain from ankle to hip  Pt has been off of Lipitor 5 days, still having some pain  Please call back

## 2016-08-16 NOTE — Telephone Encounter (Signed)
Pt called stating that she had started having radiating pain in her left leg, ankle and her hip about 1 week ago. She started Lipitor 20 qd and HCTZ 12.5 qd 07/07/16. She spoke with her pharmacist and then decided to stop the Lipitor, due to side effects. Pt states that she has been off of it now X's 5 days, and is some better, but still having some pain.  Could HCTZ cause it?  Pt stated she looked it up online and stated that it was possible, but she didn't just want to come off of that one as well.  Could her K+ be low?  She is supposed to get Lipid & LFT on this Thursday, can we add BMET just to check?  Please advise your recommendations!

## 2016-08-17 ENCOUNTER — Other Ambulatory Visit: Payer: Self-pay | Admitting: Physician Assistant

## 2016-08-18 ENCOUNTER — Other Ambulatory Visit: Payer: BLUE CROSS/BLUE SHIELD | Admitting: *Deleted

## 2016-08-18 DIAGNOSIS — I7300 Raynaud's syndrome without gangrene: Secondary | ICD-10-CM

## 2016-08-18 DIAGNOSIS — I341 Nonrheumatic mitral (valve) prolapse: Secondary | ICD-10-CM

## 2016-08-18 DIAGNOSIS — E785 Hyperlipidemia, unspecified: Secondary | ICD-10-CM

## 2016-08-18 DIAGNOSIS — Z79899 Other long term (current) drug therapy: Secondary | ICD-10-CM | POA: Diagnosis not present

## 2016-08-18 DIAGNOSIS — I10 Essential (primary) hypertension: Secondary | ICD-10-CM | POA: Diagnosis not present

## 2016-08-18 LAB — LIPID PANEL
Cholesterol: 193 mg/dL (ref <200)
HDL: 82 mg/dL (ref >50)
LDL Cholesterol: 92 mg/dL (ref <100)
Total CHOL/HDL Ratio: 2.4 Ratio (ref <5.0)
Triglycerides: 95 mg/dL (ref <150)
VLDL: 19 mg/dL (ref <30)

## 2016-08-18 LAB — HEPATIC FUNCTION PANEL
ALT: 20 U/L (ref 6–29)
AST: 21 U/L (ref 10–35)
Albumin: 4.6 g/dL (ref 3.6–5.1)
Alkaline Phosphatase: 42 U/L (ref 33–130)
Bilirubin, Direct: 0.1 mg/dL (ref <=0.2)
Indirect Bilirubin: 0.6 mg/dL (ref 0.2–1.2)
Total Bilirubin: 0.7 mg/dL (ref 0.2–1.2)
Total Protein: 6.8 g/dL (ref 6.1–8.1)

## 2016-08-18 LAB — COMPREHENSIVE METABOLIC PANEL
ALT: 20 U/L (ref 6–29)
AST: 20 U/L (ref 10–35)
Albumin: 4.6 g/dL (ref 3.6–5.1)
Alkaline Phosphatase: 42 U/L (ref 33–130)
BUN: 12 mg/dL (ref 7–25)
CO2: 27 mmol/L (ref 20–31)
Calcium: 9.4 mg/dL (ref 8.6–10.4)
Chloride: 92 mmol/L — ABNORMAL LOW (ref 98–110)
Creat: 0.93 mg/dL (ref 0.50–1.05)
Glucose, Bld: 116 mg/dL — ABNORMAL HIGH (ref 65–99)
Potassium: 3.9 mmol/L (ref 3.5–5.3)
Sodium: 132 mmol/L — ABNORMAL LOW (ref 135–146)
Total Bilirubin: 0.7 mg/dL (ref 0.2–1.2)
Total Protein: 6.8 g/dL (ref 6.1–8.1)

## 2016-09-30 ENCOUNTER — Encounter: Payer: Self-pay | Admitting: Cardiovascular Disease

## 2016-09-30 ENCOUNTER — Ambulatory Visit (INDEPENDENT_AMBULATORY_CARE_PROVIDER_SITE_OTHER): Payer: BLUE CROSS/BLUE SHIELD | Admitting: Cardiovascular Disease

## 2016-09-30 VITALS — BP 140/86 | HR 86 | Ht 66.0 in | Wt 136.2 lb

## 2016-09-30 DIAGNOSIS — E782 Mixed hyperlipidemia: Secondary | ICD-10-CM

## 2016-09-30 DIAGNOSIS — I341 Nonrheumatic mitral (valve) prolapse: Secondary | ICD-10-CM

## 2016-09-30 DIAGNOSIS — I10 Essential (primary) hypertension: Secondary | ICD-10-CM | POA: Diagnosis not present

## 2016-09-30 MED ORDER — ATORVASTATIN CALCIUM 20 MG PO TABS: 10.0000 mg | ORAL_TABLET | Freq: Every day | ORAL | 3 refills | Status: DC

## 2016-09-30 MED ORDER — HYDROCHLOROTHIAZIDE 25 MG PO TABS: 25.0000 mg | ORAL_TABLET | Freq: Every day | ORAL | 3 refills | Status: DC

## 2016-09-30 NOTE — Patient Instructions (Addendum)
Medication Instructions:  INCREASE HCTZ (Hydrochlorothiazide) to 25 mg once daily   Labwork: Your physician recommends that you return for lab work in: 3 weeks for basic metabolic panel  Your physician recommends that you return for lab work in: 6 months on the day of or a few days before your office visit with Dr. Acie Fredrickson.  You will need to FAST for this appointment - nothing to eat or drink after midnight the night before except water.   Testing/Procedures: None Ordered   Follow-Up: Your physician wants you to follow-up in: 6 months with Dr. Acie Fredrickson.  You will receive a reminder letter in the mail two months in advance. If you don't receive a letter, please call our office to schedule the follow-up appointment.   If you need a refill on your cardiac medications before your next appointment, please call your pharmacy.   Thank you for choosing [facility]! Christen Bame, RN [phone]

## 2016-09-30 NOTE — Progress Notes (Signed)
Katrina [name] Date of Birth  Jan 30, 1961 Katrina [name] [address]    [place]   [name], [name]  [address]    [name], [name]  [address] [phone]  Fax  [phone]  [phone]  Fax [phone]  Problem List: 1. Hypetension 2. Mitral valve prolapse.  3. Hyperlipidemia   History of Present Illness:  Katrina [name] is a 56 yo who presents today for evaluation of HTN.  She was noted to have elevation of her diastolic blood pressure.  She's also been having some dyspnea with exertion. She went hiking at the [facility] [name] recently and had significant shortness breath climbing up the trail.  She feels like her metabolism has slowed recently.  She walks her dogs 2 miles a day without problems.   She has been working out and walking regularly.    November 09, 2012:  Katrina [name] was tried on Losartan - she was feeling poorly and her primary recommended that she stop.  She stopped the Losartan and is feeling better.   December 01, 2015:  Katrina [name] is seen back today after 2 year absence. Doing ok Had some pleurisy last month.  BP was a little elevated with that .  Has had some heart racing when traveling [name].   Walk every day.     Feb. 2, 2018: Doing well. No complaints  Not walking as much  Went to [name] this past summer   BP has been elevated.    Current Outpatient Prescriptions on File Prior to Visit  Medication Sig Dispense Refill  . amLODipine (NORVASC) 2.5 MG tablet Take 1 tablet (2.5 mg total) by mouth daily. 90 tablet 3  . aspirin EC 81 MG tablet Take 81 mg by mouth as needed.     Marland Kitchen atorvastatin (LIPITOR) 20 MG tablet Take 0.5 tablets (10 mg total) by mouth daily. 45 tablet 0  . cholecalciferol (VITAMIN D) 1000 units tablet Take 2,000 Units by mouth daily.     . diclofenac sodium (VOLTAREN) 1 % GEL Apply 2 g topically as needed (KNEEE PAIN).   0  . hydrochlorothiazide (MICROZIDE) 12.5 MG capsule Take 1 capsule (12.5  mg total) by mouth daily. 30 capsule 10  . potassium chloride SA (K-DUR,KLOR-CON) 20 MEQ tablet Take 1 tablet (20 mEq total) by mouth daily. 30 tablet 10  . TURMERIC PO Take 1 capsule by mouth daily.     No current facility-administered medications on file prior to visit.     Allergies  Allergen Reactions  . Cefuroxime Diarrhea and Nausea And Vomiting    Caused C Dif    Past Medical History:  Diagnosis Date  . Anal fissure   . C. difficile diarrhea    04/2013  . Constipation    chronic her entire life  . Diverticulosis   . Family history of breast cancer in first degree relative   . Frozen shoulder    history of bilateral frozen shoulder  . GERD (gastroesophageal reflux disease)   . Headache(784.0)   . History of abnormal Pap smear    S/P lazer conization  . Hyperlipidemia   . Hypertension   . Lupus anticoagulant disorder ([place])    refuted by hematologist in 2014  . Menopause   . Mitral valve prolapse    a. echo 01/2009 showed EF 123456, systolic bowing of mitral valve without prolapse.   . Osteopenia   . Raynaud disease    a.  question false positive antiphospholipid panel-> sx later felt due to Raynaud itself (transient finger discoloration) rather than embolic phenomena.  . Vitamin D deficiency     Past Surgical History:  Procedure Laterality Date  . FOOT SURGERY  2007   left  . KNEE SURGERY  2006   left  . WISDOM TOOTH EXTRACTION  1981    History  Smoking Status  . Former Smoker  . Packs/day: 1.00  . Years: 6.00  . Types: Cigarettes  . Quit date: 07/02/1991  Smokeless Tobacco  . Never Used    History  Alcohol Use  . Yes    Comment: 1-2 glasses of wine rarely    Family History  Problem Relation Age of Onset  . Colon cancer Mother 68  . Breast cancer Mother   . Hypertension Mother   . Anemia Father   . Heart disease Father     CHF  . Lung disease Father   . Atrial fibrillation Father   . Breast cancer Maternal Aunt   . Cancer Paternal Aunt      brain  . Heart disease Maternal Grandmother   . Cancer Maternal Grandfather     throat  . Stroke Sister   . Heart attack Neg Hx     Reviw of Systems:  Reviewed in the HPI.  All other systems are negative.  Physical Exam: Blood pressure 140/86, pulse 86, height 5\' 6"  (1.676 m), weight 136 lb 3 oz (61.8 kg). General: Well developed, well nourished, in no acute distress.  Head: Normocephalic, atraumatic, sclera non-icteric, mucus membranes are moist,   Neck: Supple. Carotids are 2 + without bruits. No JVD  Lungs: Clear bilaterally to auscultation.  Heart: regular rate .  There is a soft murmur.  Abdomen: Soft, non-tender, non-distended with normal bowel sounds. No hepatomegaly. No rebound/guarding. No masses.  Msk:  Strength and tone are normal  Extremities: No clubbing or cyanosis. No edema.  Distal pedal pulses are 2+ and equal bilaterally.  Neuro: Alert and oriented X 3. Moves all extremities spontaneously.  Psych:  Responds to questions appropriately with a normal affect.  ECG:  Assessment / Plan:    1. Hypertension - her blood pressure at has been a little bit elevated. We will increase the HCTZ to 25 mg a day. We'll check a basic medical profile in 3 weeks.  2. Mitral valve prolapse.  Stable   3. Hyperlipidemia Her last LDL is 140 .  Has a family hx of CAD. Continue atorvastatin. We'll check her fasting labs in 6 months with her next office visit. Marland Kitchen    Katrina Moores, MD  09/30/2016 4:23 PM    [name],  [name], [place]  [address] Pager [phone] Phone: [phone]; Fax: [phone]

## 2016-10-06 DIAGNOSIS — Z1231 Encounter for screening mammogram for malignant neoplasm of breast: Secondary | ICD-10-CM | POA: Diagnosis not present

## 2016-10-06 DIAGNOSIS — Z6821 Body mass index (BMI) 21.0-21.9, adult: Secondary | ICD-10-CM | POA: Diagnosis not present

## 2016-10-06 DIAGNOSIS — Z01419 Encounter for gynecological examination (general) (routine) without abnormal findings: Secondary | ICD-10-CM | POA: Diagnosis not present

## 2016-10-13 NOTE — Progress Notes (Addendum)
HPI:  Here for CPE:  -Concerns and/or follow up today:  Rarely comes in for visits, last seen > 1 year ago. Appears on ROC sees her cardiologist several times. Had CMP and lipid panel with cardiology recently. Blood glu borderline. No on several antihypertensives and reports is doing well. Is eating a healthier diet. Less regular aerobic exercise in the morning. Had flu shot and most labs elsewhere, reports has more labs with her cardiologist coming up. Sees gynecologist and had pap, mammo, gyn exams recently with Dr. Corinna Capra. PMH summarized below.  HTN/Hx MVP/HLD: -sees cardiologist -meds:started on hctz and norvasc with cardiology, takes asa and statin as well -denies: CP, SOB, DOE  Optic Migraines: -sees neurologist  Chronic constipation/GERD: -sees Dr. Fuller Plan  Meniscal tear: -goes to [facility], getting MRI next week as has had slow recovery from surgery on R knee   Osteopenia: -taking Vit D daily  Vaginal Atrophy: -uses vaginal estrogen intermittently  -Vaccines: UTD  -sexual activity: yes, female partner, no new partners  -wants STI testing (Hep C if born [birth year]): no  -FH breast, colon or ovarian ca: see FH Last mammogram: done Last colon cancer screening: done -Alcohol, Tobacco, drug use: see social history  Review of Systems - no fevers, unintentional weight loss, vision loss, hearing loss, chest pain, sob, hemoptysis, melena, hematochezia, hematuria, genital discharge, changing or concerning skin lesions, bleeding, bruising, loc, thoughts of self harm or SI  Past Medical History:  Diagnosis Date  . Anal fissure   . C. difficile diarrhea    04/2013  . Constipation    chronic her entire life  . Diverticulosis   . Family history of breast cancer in first degree relative   . Frozen shoulder    history of bilateral frozen shoulder  . GERD (gastroesophageal reflux disease)   . Headache(784.0)   . History of abnormal Pap smear    S/P lazer conization  .  Hyperlipidemia   . Hypertension   . Lupus anticoagulant disorder ([place])    refuted by hematologist in 2014  . Menopause   . Mitral valve prolapse    a. echo 01/2009 showed EF [id number], systolic bowing of mitral valve without prolapse.   . Osteopenia   . Raynaud disease    a. question false positive antiphospholipid panel-> sx later felt due to Raynaud itself (transient finger discoloration) rather than embolic phenomena.  . Vitamin D deficiency     Past Surgical History:  Procedure Laterality Date  . FOOT SURGERY  2007   left  . KNEE SURGERY  2006   left  . WISDOM TOOTH EXTRACTION  1981    Family History  Problem Relation Age of Onset  . Colon cancer Mother 58  . Breast cancer Mother   . Hypertension Mother   . Anemia Father   . Heart disease Father     CHF  . Lung disease Father   . Atrial fibrillation Father   . Breast cancer Maternal Aunt   . Cancer Paternal Aunt     brain  . Heart disease Maternal Grandmother   . Cancer Maternal Grandfather     throat  . Stroke Sister   . Heart attack Neg Hx     Social History   Social History  . Marital status: Married    Spouse name: N/A  . Number of children: 3  . Years of education: N/A   Occupational History  . architect Other  .  [employer]   Social History Main  Topics  . Smoking status: Former Smoker    Packs/day: 1.00    Years: 6.00    Types: Cigarettes    Quit date: 07/02/1991  . Smokeless tobacco: Never Used  . Alcohol use Yes     Comment: 1-2 glasses of wine rarely  . Drug use: No  . Sexual activity: Yes    Partners: Male   Other Topics Concern  . 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  amLODipine (NORVASC) 2.5 MG tablet, Take 1 tablet (2.5 mg total) by mouth daily., Disp: 90 tablet, Rfl: 3 .  aspirin EC 81 MG tablet, Take 81 mg by mouth as needed. , Disp: , Rfl:  .  atorvastatin (LIPITOR) 20 MG tablet, Take 0.5 tablets (10 mg total) by mouth daily., Disp: 45 tablet, Rfl: 3 .  cholecalciferol (VITAMIN D) 1000 units tablet, Take 2,000 Units by mouth daily. , Disp: , Rfl:  .  diclofenac sodium (VOLTAREN) 1 % GEL, Apply 2 g topically as needed (KNEEE PAIN). , Disp: , Rfl: 0 .  hydrochlorothiazide (HYDRODIURIL) 25 MG tablet, Take 1 tablet (25 mg total) by mouth daily., Disp: 90 tablet, Rfl: 3 .  potassium chloride SA (K-DUR,KLOR-CON) 20 MEQ tablet, Take 1 tablet (20 mEq total) by mouth daily., Disp: 30 tablet, Rfl: 10 .  TURMERIC PO, Take 1 capsule by mouth daily., Disp: , Rfl:   EXAM:  Vitals:   10/14/16 0916  BP: 128/82  Pulse: 82  Temp: 98.7 F (37.1 C)    GENERAL: vitals reviewed and listed below, alert, oriented, appears well hydrated and in no acute distress  HEENT: head atraumatic, PERRLA, normal appearance of eyes, ears, nose and mouth. moist mucus membranes.  NECK: supple, no masses or lymphadenopathy  LUNGS: clear to auscultation bilaterally, no rales, rhonchi or wheeze  CV: HRRR, no peripheral edema or cyanosis, normal pedal pulses  ABDOMEN: bowel sounds normal, soft, non tender to palpation, no masses, no rebound or guarding  GU/BREAST: declined  SKIN: no rash or abnormal lesions, declined full skin check  MS: normal gait, moves all extremities normally  NEURO: normal gait, speech and thought processing grossly intact, muscle tone grossly intact throughout  PSYCH: normal affect, pleasant and cooperative  ASSESSMENT AND PLAN:  Discussed the following assessment and plan:  Encounter for preventive health examination - Plan: POCT A1C  Hyperlipidemia, unspecified hyperlipidemia type  Essential  hypertension  Disorder of bone and cartilage  Hyperglycemia  -Discussed and advised all [place] preventive services health task force level A and B recommendations for age, sex and risks.  -Advised at least 150 minutes of exercise per week and a healthy diet with avoidance of (less then 1 serving per week) processed foods, white starches, red meat, fast foods and sweets and consisting of: * 5-9 servings of fresh fruits and vegetables (not corn or potatoes) *nuts and seeds, beans *olives and olive oil *lean meats such as fish and  white chicken  *whole grains  -labs, studies and vaccines per orders this encounter  Orders Placed This Encounter  Procedures  . POCT A1C    Patient advised to return to clinic immediately if symptoms worsen or persist or new concerns.  Patient Instructions  BEFORE YOU LEAVE: -POC Hgba1c -follow up: yearly for CPE and every 3-4 months with me or cardiologist regarding blood pressure.   We recommend the following healthy lifestyle for LIFE: 1) Small portions.   Tip: eat off of a salad plate instead of a dinner plate.  Tip: if you need more or a snack choose fruits, veggies and/or a handful of nuts or seeds.  2) Eat a healthy clean diet.  * Tip: Avoid (less then 1 serving per week): processed foods, sweets, sweetened drinks, white starches (rice, flour, bread, potatoes, pasta, etc), red meat, fast foods, butter  *Tip: CHOOSE instead   * 5-9 servings per day of fresh or frozen fruits and vegetables (but not corn, potatoes, bananas, canned or dried fruit)   *nuts and seeds, beans   *olives and olive oil   *small portions of lean meats such as fish and white chicken    *small portions of whole grains  3)Get at least 150 minutes of sweaty aerobic exercise per week.  4)Reduce stress - consider counseling, meditation and relaxation to balance other aspects of your life.     No Follow-up on file.  Colin Benton R., DO

## 2016-10-14 ENCOUNTER — Encounter: Payer: Self-pay | Admitting: Family Medicine

## 2016-10-14 ENCOUNTER — Ambulatory Visit (INDEPENDENT_AMBULATORY_CARE_PROVIDER_SITE_OTHER): Payer: BLUE CROSS/BLUE SHIELD | Admitting: Family Medicine

## 2016-10-14 VITALS — BP 128/82 | HR 82 | Temp 98.7°F | Ht 65.75 in | Wt 136.4 lb

## 2016-10-14 DIAGNOSIS — R739 Hyperglycemia, unspecified: Secondary | ICD-10-CM

## 2016-10-14 DIAGNOSIS — M899 Disorder of bone, unspecified: Secondary | ICD-10-CM | POA: Diagnosis not present

## 2016-10-14 DIAGNOSIS — Z0000 Encounter for general adult medical examination without abnormal findings: Secondary | ICD-10-CM | POA: Diagnosis not present

## 2016-10-14 DIAGNOSIS — E785 Hyperlipidemia, unspecified: Secondary | ICD-10-CM

## 2016-10-14 DIAGNOSIS — M949 Disorder of cartilage, unspecified: Secondary | ICD-10-CM

## 2016-10-14 DIAGNOSIS — I10 Essential (primary) hypertension: Secondary | ICD-10-CM

## 2016-10-14 LAB — POCT GLYCOSYLATED HEMOGLOBIN (HGB A1C): Hemoglobin A1C: 5.5

## 2016-10-14 NOTE — Progress Notes (Signed)
Pre visit review using our clinic review tool, if applicable. No additional management support is needed unless otherwise documented below in the visit note. 

## 2016-10-14 NOTE — Addendum Note (Signed)
Addended by: Lucretia Kern on: 10/14/2016 10:24 AM   Modules accepted: Level of Service

## 2016-10-14 NOTE — Patient Instructions (Addendum)
BEFORE YOU LEAVE: -POC Hgba1c -follow up: yearly for CPE and every 3-4 months with me or cardiologist regarding blood pressure.   We recommend the following healthy lifestyle for LIFE: 1) Small portions.   Tip: eat off of a salad plate instead of a dinner plate.  Tip: if you need more or a snack choose fruits, veggies and/or a handful of nuts or seeds.  2) Eat a healthy clean diet.  * Tip: Avoid (less then 1 serving per week): processed foods, sweets, sweetened drinks, white starches (rice, flour, bread, potatoes, pasta, etc), red meat, fast foods, butter  *Tip: CHOOSE instead   * 5-9 servings per day of fresh or frozen fruits and vegetables (but not corn, potatoes, bananas, canned or dried fruit)   *nuts and seeds, beans   *olives and olive oil   *small portions of lean meats such as fish and white chicken    *small portions of whole grains  3)Get at least 150 minutes of sweaty aerobic exercise per week.  4)Reduce stress - consider counseling, meditation and relaxation to balance other aspects of your life.

## 2016-10-21 ENCOUNTER — Other Ambulatory Visit: Payer: BLUE CROSS/BLUE SHIELD

## 2016-10-27 ENCOUNTER — Other Ambulatory Visit: Payer: BLUE CROSS/BLUE SHIELD | Admitting: *Deleted

## 2016-10-27 DIAGNOSIS — I10 Essential (primary) hypertension: Secondary | ICD-10-CM

## 2016-10-27 LAB — BASIC METABOLIC PANEL
BUN/Creatinine Ratio: 13 (ref 9–23)
BUN: 10 mg/dL (ref 6–24)
CO2: 24 mmol/L (ref 18–29)
Calcium: 9.7 mg/dL (ref 8.7–10.2)
Chloride: 91 mmol/L — ABNORMAL LOW (ref 96–106)
Creatinine, Ser: 0.78 mg/dL (ref 0.57–1.00)
GFR calc Af Amer: 98 mL/min/{1.73_m2} (ref >59)
GFR calc non Af Amer: 85 mL/min/{1.73_m2} (ref >59)
Glucose: 100 mg/dL — ABNORMAL HIGH (ref 65–99)
Potassium: 3.9 mmol/L (ref 3.5–5.2)
Sodium: 134 mmol/L (ref 134–144)

## 2016-10-27 NOTE — Addendum Note (Signed)
Addended by: Eulis Foster on: 10/27/2016 10:35 AM   Modules accepted: Orders

## 2016-12-03 IMAGING — CT CT HEAD W/O CM
3 of 4 series · 17 of 47 positions shown, 20 images · non-contrast
Comparison: None.

CLINICAL DATA: Dysarthria for 1 day. Altered mental status.
Dizziness. Hypertension.

EXAM:
CT HEAD WITHOUT CONTRAST
TECHNIQUE: Contiguous axial images were obtained from the base of the skull
through the vertex without intravenous contrast.

[Series 201: head w/o, idose (1) · axial · non-contrast · 0.49mm/px · z∈[+96,+216]mm · 11 of 29 slices shown, 14 images]
[im 3/29  brain]
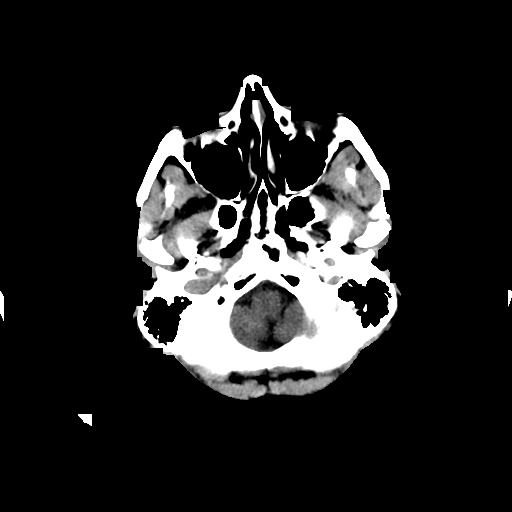
[im 3/29  bone]
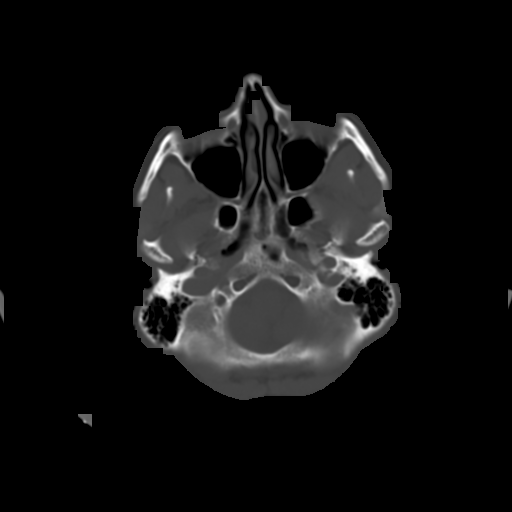
[im 5/29  brain]
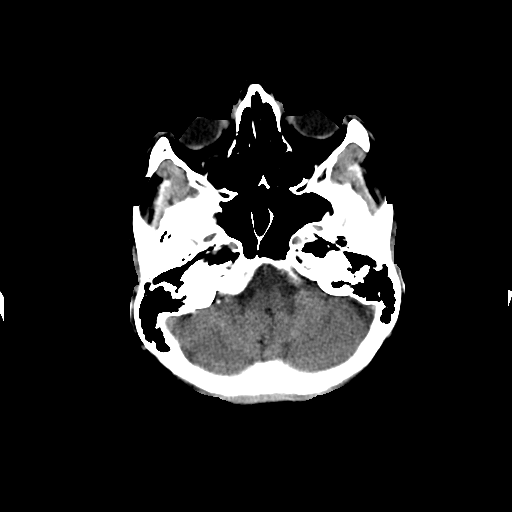
[im 7/29  brain]
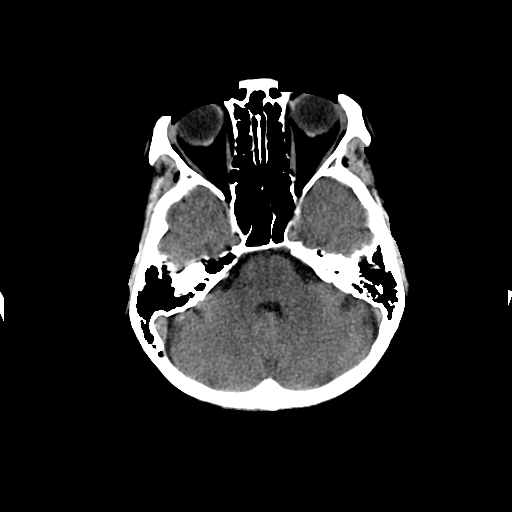
[im 11/29  brain]
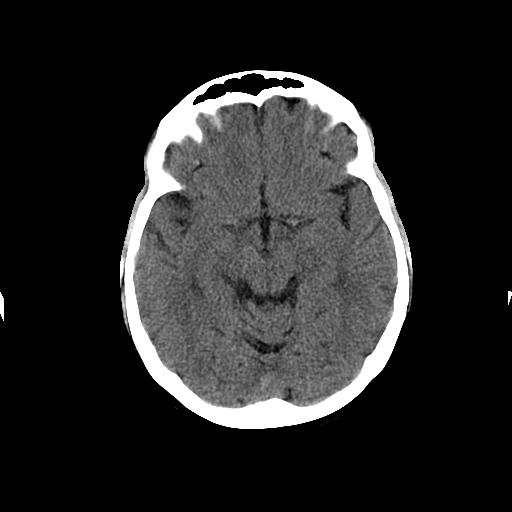
[im 13/29  brain]
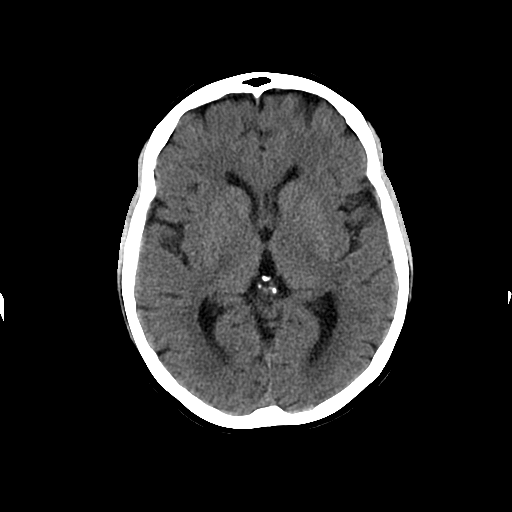
[im 13/29  bone]
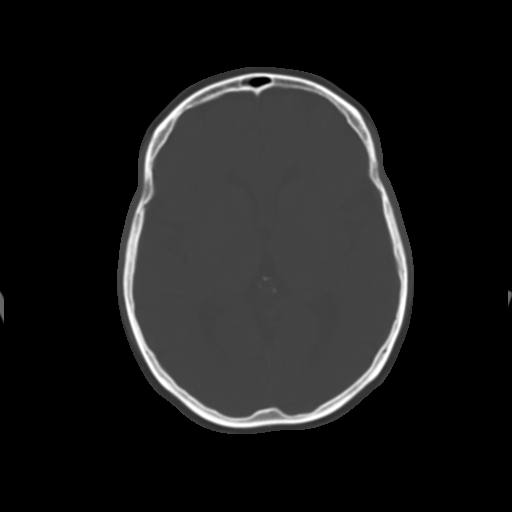
[im 15/29  brain]
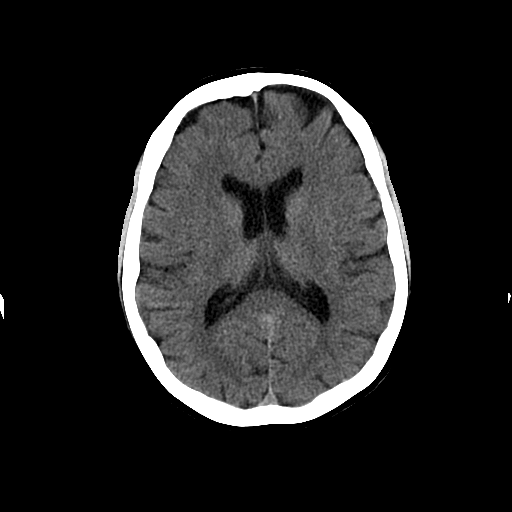
[im 17/29  brain]
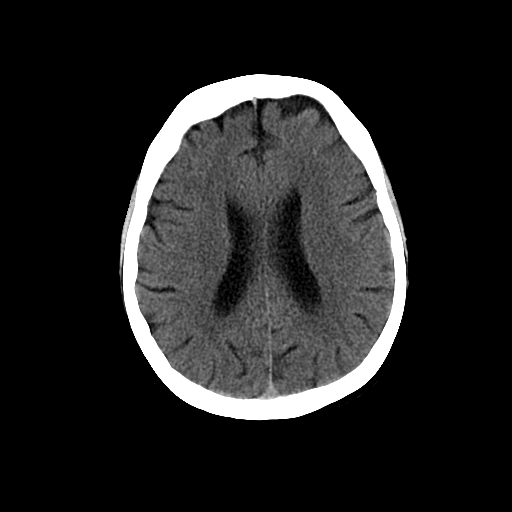
[im 19/29  brain]
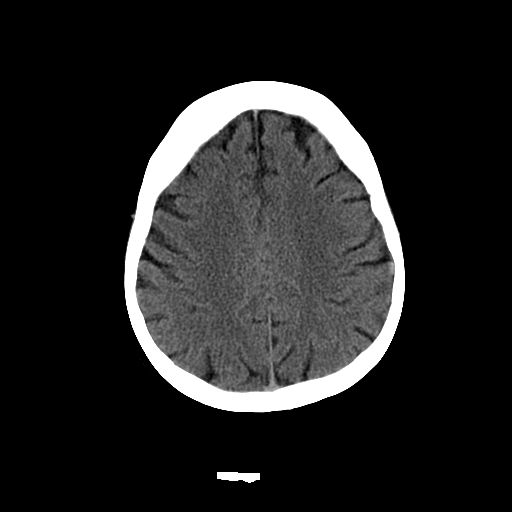
[im 23/29  brain]
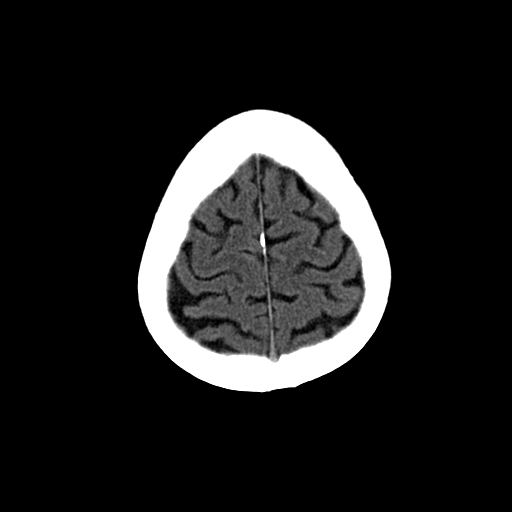
[im 23/29  bone]
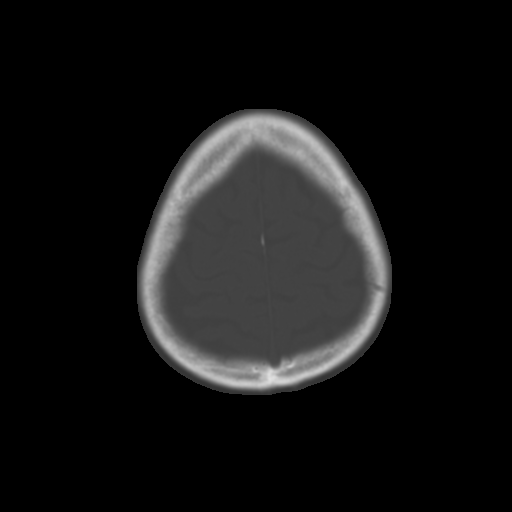
[im 25/29  brain]
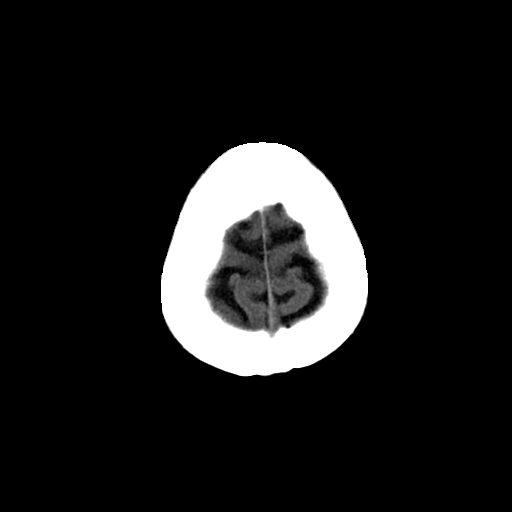
[im 27/29  brain]
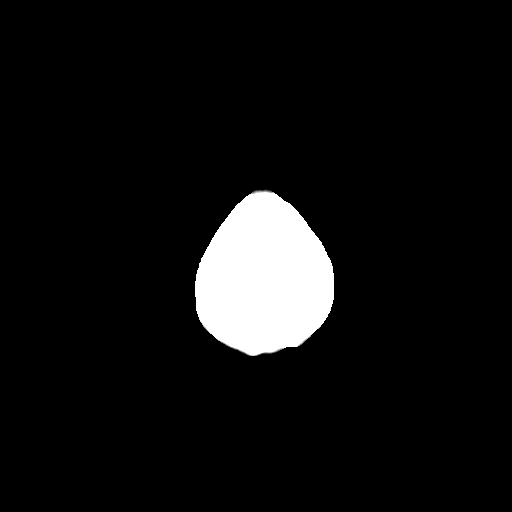

[Series 203: coronal st, idose (1) · coronal · 0.40mm/px · 3 of 74 slices shown]
[im 25/74  brain]
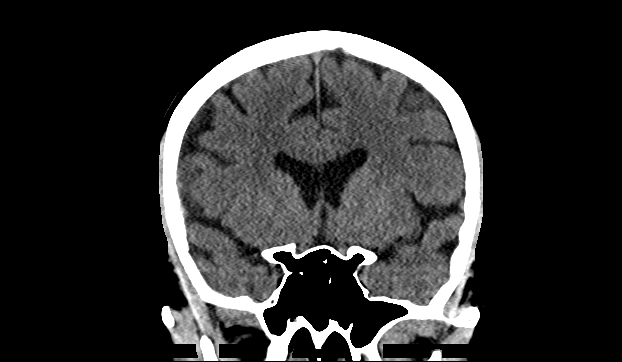
[im 33/74  brain]
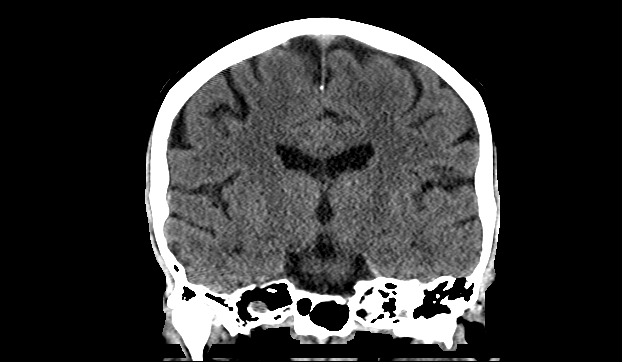
[im 41/74  brain]
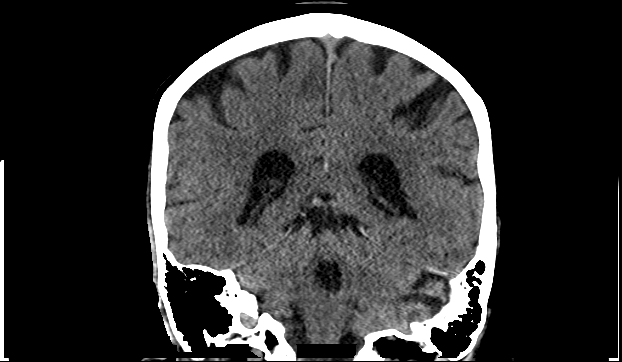

[Series 204: sagittal st, idose (1) · sagittal · 0.40mm/px · 3 of 83 slices shown]
[im 28/83  brain]
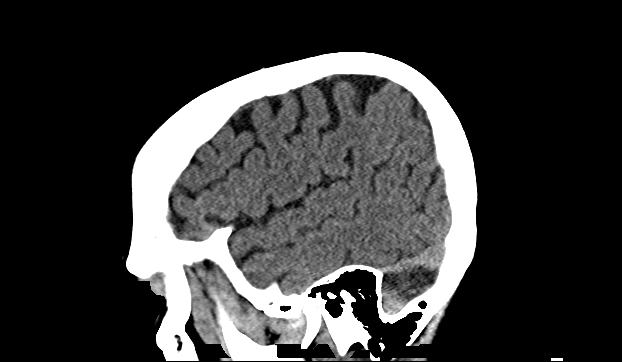
[im 42/83  brain]
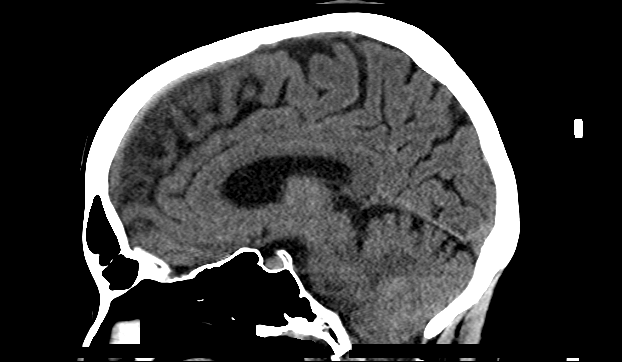
[im 55/83  brain]
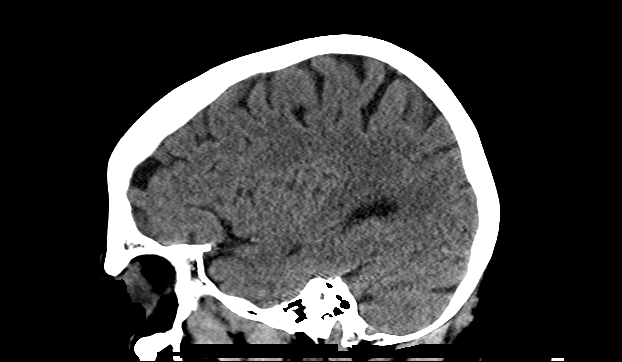

[17 of 47 positions shown; findings below may reference images not displayed]

FINDINGS: The ventricles are normal in size and configuration. There is no
intracranial mass, hemorrhage, extra-axial fluid collection, or
midline shift. Gray-white compartments are normal. No acute infarct
is evident. The bony calvarium appears intact. Visualized mastoid
air cells are clear. Orbits appear symmetric bilaterally. There is
leftward deviation of the posterior nasal septum with a bony spur
toward the left.
IMPRESSION: No intracranial mass, hemorrhage, or focal gray -white compartment
lesion/acute appearing infarct. Deviated nasal septum.

## 2016-12-07 ENCOUNTER — Other Ambulatory Visit: Payer: Self-pay | Admitting: Nurse Practitioner

## 2016-12-07 MED ORDER — PROPRANOLOL HCL 10 MG PO TABS: 10.0000 mg | ORAL_TABLET | Freq: Four times a day (QID) | ORAL | 6 refills | Status: DC | PRN

## 2017-02-23 ENCOUNTER — Other Ambulatory Visit: Payer: Self-pay | Admitting: Physician Assistant

## 2017-04-10 DIAGNOSIS — N390 Urinary tract infection, site not specified: Secondary | ICD-10-CM | POA: Diagnosis not present

## 2017-04-10 DIAGNOSIS — R030 Elevated blood-pressure reading, without diagnosis of hypertension: Secondary | ICD-10-CM | POA: Diagnosis not present

## 2017-05-18 ENCOUNTER — Encounter: Payer: Self-pay | Admitting: Family Medicine

## 2017-05-24 ENCOUNTER — Other Ambulatory Visit: Payer: Self-pay | Admitting: Nurse Practitioner

## 2017-05-24 ENCOUNTER — Other Ambulatory Visit: Payer: BLUE CROSS/BLUE SHIELD | Admitting: *Deleted

## 2017-05-24 DIAGNOSIS — I10 Essential (primary) hypertension: Secondary | ICD-10-CM | POA: Diagnosis not present

## 2017-05-24 DIAGNOSIS — E782 Mixed hyperlipidemia: Secondary | ICD-10-CM | POA: Diagnosis not present

## 2017-05-24 LAB — LIPID PANEL
Chol/HDL Ratio: 2.2 ratio (ref 0.0–4.4)
Cholesterol, Total: 217 mg/dL — ABNORMAL HIGH (ref 100–199)
HDL: 99 mg/dL (ref >39)
LDL Calculated: 106 mg/dL — ABNORMAL HIGH (ref 0–99)
Triglycerides: 60 mg/dL (ref 0–149)
VLDL Cholesterol Cal: 12 mg/dL (ref 5–40)

## 2017-05-24 LAB — BASIC METABOLIC PANEL
BUN/Creatinine Ratio: 13 (ref 9–23)
BUN: 9 mg/dL (ref 6–24)
CO2: 24 mmol/L (ref 20–29)
Calcium: 9.7 mg/dL (ref 8.7–10.2)
Chloride: 94 mmol/L — ABNORMAL LOW (ref 96–106)
Creatinine, Ser: 0.72 mg/dL (ref 0.57–1.00)
GFR calc Af Amer: 108 mL/min/{1.73_m2} (ref >59)
GFR calc non Af Amer: 93 mL/min/{1.73_m2} (ref >59)
Glucose: 92 mg/dL (ref 65–99)
Potassium: 4 mmol/L (ref 3.5–5.2)
Sodium: 137 mmol/L (ref 134–144)

## 2017-05-24 LAB — HEPATIC FUNCTION PANEL
ALT: 22 IU/L (ref 0–32)
AST: 24 IU/L (ref 0–40)
Albumin: 4.8 g/dL (ref 3.5–5.5)
Alkaline Phosphatase: 46 IU/L (ref 39–117)
Bilirubin Total: 0.4 mg/dL (ref 0.0–1.2)
Bilirubin, Direct: 0.13 mg/dL (ref 0.00–0.40)
Total Protein: 7.4 g/dL (ref 6.0–8.5)

## 2017-05-28 NOTE — Progress Notes (Signed)
Katrina [name] Date of Birth  20-Feb-1961 [facility] [address]    [place]   [place], [place]  [address]    [place],   [address] [phone]  Fax  [phone]  [phone]  Fax [phone]  Problem List: 1. Hypetension 2. Mitral valve prolapse.  3. Hyperlipidemia   Previous Notes   Katrina [name] is a 56 yo who presents today for evaluation of HTN.  She was noted to have elevation of her diastolic blood pressure.  She's also been having some dyspnea with exertion. She went hiking at the [facility] recently and had significant shortness breath climbing up the trail.  She feels like her metabolism has slowed recently.  She walks her dogs 2 miles a day without problems.   She has been working out and walking regularly.    November 09, 2012:  Katrina [name] was tried on Losartan - she was feeling poorly and her primary recommended that she stop.  She stopped the Losartan and is feeling better.   December 01, 2015:  Katrina [name] is seen back today after 2 year absence. Doing ok Had some pleurisy last month.  BP was a little elevated with that .  Has had some heart racing when traveling [place].   Walk every day.     Feb. 2, 2018: Doing well. No complaints  Not walking as much  Went to [place] this past summer   BP has been elevated.   Oct. 1, 2018:  Katrina [name] is seen today  Doing well. Walking regularly,  No CP or dyspnea  Has some allergies  LDL was 106 last week - takes Atorvastatin 10 mg a day    Current Outpatient Prescriptions on File Prior to Visit  Medication Sig Dispense Refill  . amLODipine (NORVASC) 2.5 MG tablet Take 1 tablet (2.5 mg total) by mouth daily. 90 tablet 1  . aspirin EC 81 MG tablet Take 81 mg by mouth as needed.     Marland Kitchen atorvastatin (LIPITOR) 20 MG tablet Take 0.5 tablets (10 mg total) by mouth daily. 45 tablet 3  . cholecalciferol (VITAMIN D) 1000 units tablet Take 2,000 Units by mouth daily.      . diclofenac sodium (VOLTAREN) 1 % GEL Apply 2 g topically as needed (KNEEE PAIN).   0  . hydrochlorothiazide (HYDRODIURIL) 25 MG tablet Take 1 tablet (25 mg total) by mouth daily. 90 tablet 3  . potassium chloride SA (K-DUR,KLOR-CON) 20 MEQ tablet Take 1 tablet (20 mEq total) by mouth daily. 30 tablet 10  . propranolol (INDERAL) 10 MG tablet Take 1 tablet (10 mg total) by mouth 4 (four) times daily as needed (palpitations/fast heart rate). 60 tablet 6  . TURMERIC PO Take 1 capsule by mouth daily.     No current facility-administered medications on file prior to visit.     Allergies  Allergen Reactions  . Cefuroxime Diarrhea and Nausea And Vomiting    Caused C Dif    Past Medical History:  Diagnosis Date  . Anal fissure   . C. difficile diarrhea    04/2013  . Constipation    chronic her entire life  . Diverticulosis   . Family history of breast cancer in first degree relative   . Frozen shoulder    history of bilateral frozen shoulder  . GERD (gastroesophageal reflux disease)   . Headache(784.0)   . History of abnormal Pap smear  S/P lazer conization  . Hyperlipidemia   . Hypertension   . Lupus anticoagulant disorder ([place])    refuted by hematologist in 2014  . Menopause   . Mitral valve prolapse    a. echo 01/2009 showed EF 79-39%, systolic bowing of mitral valve without prolapse.   . Osteopenia   . Raynaud disease    a. question false positive antiphospholipid panel-> sx later felt due to Raynaud itself (transient finger discoloration) rather than embolic phenomena.  . Vitamin D deficiency     Past Surgical History:  Procedure Laterality Date  . FOOT SURGERY  2007   left  . KNEE SURGERY  2006   left  . WISDOM TOOTH EXTRACTION  1981    History  Smoking Status  . Former Smoker  . Packs/day: 1.00  . Years: 6.00  . Types: Cigarettes  . Quit date: 07/02/1991  Smokeless Tobacco  . Never Used    History  Alcohol Use  . Yes    Comment: 1-2 glasses of wine  rarely    Family History  Problem Relation Age of Onset  . Colon cancer Mother 18  . Breast cancer Mother   . Hypertension Mother   . Anemia Father   . Heart disease Father        CHF  . Lung disease Father   . Atrial fibrillation Father   . Breast cancer Maternal Aunt   . Cancer Paternal Aunt        brain  . Heart disease Maternal Grandmother   . Cancer Maternal Grandfather        throat  . Stroke Sister   . Heart attack Neg Hx     Reviw of Systems:  Reviewed in the HPI.  All other systems are negative.  Physical Exam: There were no vitals taken for this visit.  GEN:  Well nourished, well developed in no acute distress HEENT: Normal NECK: No JVD; No carotid bruits LYMPHATICS: No lymphadenopathy CARDIAC: RRR , soft systolic  murmur, no rubs, gallops RESPIRATORY:  Clear to auscultation without rales, wheezing or rhonchi  ABDOMEN: Soft, non-tender, non-distended MUSCULOSKELETAL:  No edema; No deformity  SKIN: Warm and dry NEUROLOGIC:  Alert and oriented x 3  ECG: 05/29/2017:  Normal sinus rhythm at 75. Possible left atrial enlargement. Voltage for left ventricular hypertrophy  Assessment / Plan:    1. Hypertension - blood pressure is well-controlled.  2. Mitral valve prolapse.  Stable. Last echo was in 2008,  Will consider repeating echo in the near future. She remains completely asymptomatic.  3. Hyperlipidemia Her LDL is a little elevated. - LDL = 106 last week .  She is eating more fats and cholesterol than she used to- fresh eggs, fried foods. She will tighten up on her diet     Mertie Moores, MD  05/28/2017 9:16 PM    [address],  [place], [place]  [address] Pager [phone] Phone: [phone]; Fax: [phone]

## 2017-05-29 ENCOUNTER — Encounter: Payer: Self-pay | Admitting: Cardiovascular Disease

## 2017-05-29 ENCOUNTER — Ambulatory Visit (INDEPENDENT_AMBULATORY_CARE_PROVIDER_SITE_OTHER): Payer: BLUE CROSS/BLUE SHIELD | Admitting: Cardiovascular Disease

## 2017-05-29 VITALS — BP 120/68 | HR 75 | Ht 66.0 in | Wt 136.6 lb

## 2017-05-29 DIAGNOSIS — I341 Nonrheumatic mitral (valve) prolapse: Secondary | ICD-10-CM | POA: Diagnosis not present

## 2017-05-29 DIAGNOSIS — E7849 Other hyperlipidemia: Secondary | ICD-10-CM

## 2017-05-29 DIAGNOSIS — I10 Essential (primary) hypertension: Secondary | ICD-10-CM

## 2017-05-29 DIAGNOSIS — E782 Mixed hyperlipidemia: Secondary | ICD-10-CM | POA: Diagnosis not present

## 2017-05-29 NOTE — Patient Instructions (Signed)

## 2017-07-04 DIAGNOSIS — Z23 Encounter for immunization: Secondary | ICD-10-CM | POA: Diagnosis not present

## 2017-07-30 ENCOUNTER — Other Ambulatory Visit: Payer: Self-pay | Admitting: Physician Assistant

## 2017-08-26 ENCOUNTER — Other Ambulatory Visit: Payer: Self-pay | Admitting: Physician Assistant

## 2017-09-07 ENCOUNTER — Encounter: Payer: Self-pay | Admitting: Family Medicine

## 2017-09-07 DIAGNOSIS — H52221 Regular astigmatism, right eye: Secondary | ICD-10-CM | POA: Diagnosis not present

## 2017-09-07 DIAGNOSIS — H5211 Myopia, right eye: Secondary | ICD-10-CM | POA: Diagnosis not present

## 2017-09-07 DIAGNOSIS — H524 Presbyopia: Secondary | ICD-10-CM | POA: Diagnosis not present

## 2017-09-07 DIAGNOSIS — H5212 Myopia, left eye: Secondary | ICD-10-CM | POA: Diagnosis not present

## 2017-09-08 DIAGNOSIS — H52221 Regular astigmatism, right eye: Secondary | ICD-10-CM | POA: Diagnosis not present

## 2017-09-08 DIAGNOSIS — H5212 Myopia, left eye: Secondary | ICD-10-CM | POA: Diagnosis not present

## 2017-09-08 DIAGNOSIS — H5211 Myopia, right eye: Secondary | ICD-10-CM | POA: Diagnosis not present

## 2017-09-08 DIAGNOSIS — H524 Presbyopia: Secondary | ICD-10-CM | POA: Diagnosis not present

## 2017-10-06 ENCOUNTER — Other Ambulatory Visit: Payer: Self-pay | Admitting: Cardiovascular Disease

## 2017-11-28 DIAGNOSIS — L309 Dermatitis, unspecified: Secondary | ICD-10-CM | POA: Diagnosis not present

## 2018-02-14 ENCOUNTER — Ambulatory Visit: Payer: BLUE CROSS/BLUE SHIELD | Admitting: Physician Assistant

## 2018-02-14 ENCOUNTER — Encounter: Payer: Self-pay | Admitting: Physician Assistant

## 2018-02-14 VITALS — BP 142/90 | HR 75 | Ht 66.0 in | Wt 143.0 lb

## 2018-02-14 DIAGNOSIS — R635 Abnormal weight gain: Secondary | ICD-10-CM | POA: Diagnosis not present

## 2018-02-14 DIAGNOSIS — I10 Essential (primary) hypertension: Secondary | ICD-10-CM | POA: Diagnosis not present

## 2018-02-14 DIAGNOSIS — I341 Nonrheumatic mitral (valve) prolapse: Secondary | ICD-10-CM

## 2018-02-14 DIAGNOSIS — I7300 Raynaud's syndrome without gangrene: Secondary | ICD-10-CM | POA: Diagnosis not present

## 2018-02-14 DIAGNOSIS — R079 Chest pain, unspecified: Secondary | ICD-10-CM | POA: Diagnosis not present

## 2018-02-14 DIAGNOSIS — Z79899 Other long term (current) drug therapy: Secondary | ICD-10-CM | POA: Diagnosis not present

## 2018-02-14 MED ORDER — AMLODIPINE BESYLATE 5 MG PO TABS: 5.0000 mg | ORAL_TABLET | Freq: Every day | ORAL | 3 refills | Status: DC

## 2018-02-14 NOTE — Patient Instructions (Signed)
Medication Instructions:  1. TRY OTC PEPCID FOR 1 WEEK TO SEE IF THIS HELPS YOUR SYMPTOMS  2. INCREASE NORVASC TO 5 MG DAILY; NEW RX HAS BEEN SENT IN    Labwork: TODAY BMET, CBC, TSH  Testing/Procedures: 1. Your physician has requested that you have a stress echocardiogram. For further information please visit [email or web address]. Please follow instruction sheet as given.  2. Your physician has requested that you have an echocardiogram. Echocardiography is a painless test that uses sound waves to create images of your heart. It provides your doctor with information about the size and shape of your heart and how well your heart's chambers and valves are working. This procedure takes approximately one hour. There are no restrictions for this procedure.    Follow-Up: SCOTT WEAVER,PAC 3-4 WEEKS SAME DAY DR. Acie Fredrickson IS IN THE OFFICE IF POSSIBLE   Any Other Special Instructions Will Be Listed Below (If Applicable).     If you need a refill on your cardiac medications before your next appointment, please call your pharmacy.

## 2018-02-14 NOTE — Progress Notes (Signed)
Cardiology Office Note:    Date:  02/14/2018   ID:  Katrina [name], DOB 1961/04/05, MRN [id number]  PCP:  Lucretia Kern, DO  Cardiologist:  Mertie Moores, MD   Referring MD: Lucretia Kern, DO   Chief Complaint  Patient presents with  . Chest Pain    History of Present Illness:    Katrina [name] is a 57 y.o. female with mitral valve prolapse, hypertension, hyperlipidemia, Raynaud's disease.  Last seen by Dr. Liam Rogers in 05/2017.    [name] presents for evaluation of chest pain.  Over the last 2 weeks, she notes occasional left-sided chest discomfort.  It is a sharp sensation and then feels like an ache.  When the pain subsides, she feels a sensation that she describes as a "pop."  She feels some radiation to her shoulder.  She feels the sensation more when she bends over.  It is not necessarily related to exertion.  She does get nauseated in the mornings.  She denies associated shortness of breath.  She denies diaphoresis.  She denies pleuritic symptoms or chest pain lying supine.  She does feel like she has indigestion at times.  She has a chronic cough without change.  She does awaken suddenly in the middle of night short of breath.  She has done this for years.  She denies orthopnea.  She does have some swelling in her legs that is worse with prolonged standing.  She has recently noted that her blood pressures are running higher.  She feels a flushing sensation in her face with this.  Prior CV studies:   The following studies were reviewed today:  Transesophageal Echo 02/17/09 EF 49-67, MV with systolic bowing but no prolapse, mild MR  Echo 10/2006 EF 55-60, MVP, mild MR, trace TR  Past Medical History:  Diagnosis Date  . Anal fissure   . C. difficile diarrhea    04/2013  . Constipation    chronic her entire life  . Diverticulosis   . Family history of breast cancer in first degree relative   . Frozen shoulder    history of bilateral frozen  shoulder  . GERD (gastroesophageal reflux disease)   . Headache(784.0)   . History of abnormal Pap smear    S/P lazer conization  . Hyperlipidemia   . Hypertension   . Lupus anticoagulant disorder ([place])    refuted by hematologist in 2014  . Menopause   . Mitral valve prolapse    a. echo 01/2009 showed EF 59-16%, systolic bowing of mitral valve without prolapse.   . Osteopenia   . Raynaud disease    a. question false positive antiphospholipid panel-> sx later felt due to Raynaud itself (transient finger discoloration) rather than embolic phenomena.  . Vitamin D deficiency    Surgical Hx: The patient  has a past surgical history that includes Knee surgery (2006); Foot surgery (2007); and Wisdom tooth extraction (1981).   Current Medications: Current Meds  Medication Sig  . aspirin EC 81 MG tablet Take 81 mg by mouth daily.  Marland Kitchen atorvastatin (LIPITOR) 20 MG tablet Take 20 mg by mouth daily.  . Cholecalciferol (VITAMIN D3) 5000 units TABS Take 1 tablet by mouth 2 (two) times daily.  . hydrochlorothiazide (HYDRODIURIL) 25 MG tablet TAKE 1 TABLET BY MOUTH DAILY.  Marland Kitchen loratadine (CLARITIN) 10 MG tablet Take 10 mg by mouth daily.  . potassium chloride SA (K-DUR,KLOR-CON) 20 MEQ tablet Take 1 tablet (20 mEq total) by mouth daily.  . [  DISCONTINUED] amLODipine (NORVASC) 2.5 MG tablet TAKE 1 TABLET ONCE DAILY.  . [DISCONTINUED] cholecalciferol (VITAMIN D) 1000 units tablet Take 2,000 Units by mouth daily.      Allergies:   Cefuroxime   Social History   Tobacco Use  . Smoking status: Former Smoker    Packs/day: 1.00    Years: 6.00    Pack years: 6.00    Types: Cigarettes    Last attempt to quit: 07/02/1991    Years since quitting: 26.6  . Smokeless tobacco: Never Used  Substance Use Topics  . Alcohol use: Yes    Comment: 1-2 glasses of wine rarely  . Drug use: No     Family Hx: The patient's family history includes Anemia in her father; Atrial fibrillation in her father; Breast  cancer in her maternal aunt and mother; Cancer in her maternal grandfather and paternal aunt; Colon cancer (age of onset: 42) in her mother; Heart disease in her father and maternal grandmother; Hypertension in her mother; Lung disease in her father; Stroke in her sister. There is no history of Heart attack.  ROS:   Please see the history of present illness.    Review of Systems  Cardiovascular: Positive for chest pain.  Hematologic/Lymphatic: Bruises/bleeds easily.  Gastrointestinal: Positive for constipation.  Neurological: Positive for dizziness and headaches.  Psychiatric/Behavioral: Positive for depression. The patient is nervous/anxious.    All other systems reviewed and are negative.   EKGs/Labs/Other Test Reviewed:    EKG:  EKG is  ordered today.  The ekg ordered today demonstrates normal sinus rhythm, heart rate 75, normal axis, QTC 439, similar to prior tracing dated 05/29/2017  Recent Labs: 05/24/2017: ALT 22; BUN 9; Creatinine, Ser 0.72; Potassium 4.0; Sodium 137   Recent Lipid Panel Lab Results  Component Value Date/Time   CHOL 217 (H) 05/24/2017 10:11 AM   TRIG 60 05/24/2017 10:11 AM   HDL 99 05/24/2017 10:11 AM   CHOLHDL 2.2 05/24/2017 10:11 AM   CHOLHDL 2.4 08/18/2016 08:35 AM   LDLCALC 106 (H) 05/24/2017 10:11 AM   LDLDIRECT 144.4 12/15/2010 08:38 AM    Physical Exam:    VS:  BP (!) 142/90   Pulse 75   Ht 5\' 6"  (1.676 m)   Wt 143 lb (64.9 kg)   BMI 23.08 kg/m     Wt Readings from Last 3 Encounters:  02/14/18 143 lb (64.9 kg)  05/29/17 136 lb 9.6 oz (62 kg)  10/14/16 136 lb 6.4 oz (61.9 kg)     Physical Exam  Constitutional: She is oriented to person, place, and time. She appears well-developed and well-nourished. No distress.  HENT:  Head: Normocephalic and atraumatic.  Neck: Neck supple. No JVD present.  Cardiovascular: Normal rate, regular rhythm, S1 normal, S2 normal and normal heart sounds.  No murmur heard. Pulmonary/Chest: Effort normal. She  has no rales.  Abdominal: Soft. There is no hepatomegaly.  Musculoskeletal: She exhibits no edema.  Neurological: She is alert and oriented to person, place, and time.  Skin: Skin is warm and dry.    ASSESSMENT & PLAN:    Chest pain Somewhat atypical.  She does have risk factors of hypertension, hyperlipidemia and prior smoking.  She has never had a stress test.  -Arrange stress echocardiogram  -Trial of Pepcid over-the-counter for 1 week  -Follow-up 3-4 weeks  Hypertension Uncontrolled.  Question if this may be contributing to her symptoms.  Increase amlodipine to 5 mg daily.  Mitral valve prolapse No echocardiogram in years.  I will arrange a follow-up echocardiogram.  Weight gain She also notes some constipation.  Obtain labs today: BMET, CBC, TSH.   Dispo:  Return in about 1 month (around 03/14/2018) for Follow up after testing, w/ Dr. Acie Fredrickson, or Richardson Dopp, PA-C.   Medication Adjustments/Labs and Tests Ordered: Current medicines are reviewed at length with the patient today.  Concerns regarding medicines are outlined above.  Tests Ordered: Orders Placed This Encounter  Procedures  . Basic Metabolic Panel (BMET)  . CBC  . TSH  . EKG 12-Lead  . ECHOCARDIOGRAM STRESS TEST  . ECHOCARDIOGRAM COMPLETE   Medication Changes: Meds ordered this encounter  Medications  . amLODipine (NORVASC) 5 MG tablet    Sig: Take 1 tablet (5 mg total) by mouth daily.    Dispense:  180 tablet    Refill:  3    DOSE INCREASE    Signed, Richardson Dopp, PA-C  02/14/2018 4:37 PM    [facility] [place], [place], [place]  [address] Phone: [phone]; Fax: [phone]

## 2018-02-15 LAB — BASIC METABOLIC PANEL
BUN/Creatinine Ratio: 12 (ref 9–23)
BUN: 9 mg/dL (ref 6–24)
CO2: 23 mmol/L (ref 20–29)
Calcium: 10.1 mg/dL (ref 8.7–10.2)
Chloride: 95 mmol/L — ABNORMAL LOW (ref 96–106)
Creatinine, Ser: 0.77 mg/dL (ref 0.57–1.00)
GFR calc Af Amer: 99 mL/min/{1.73_m2} (ref >59)
GFR calc non Af Amer: 86 mL/min/{1.73_m2} (ref >59)
Glucose: 88 mg/dL (ref 65–99)
Potassium: 3.7 mmol/L (ref 3.5–5.2)
Sodium: 135 mmol/L (ref 134–144)

## 2018-02-15 LAB — CBC
Hematocrit: 37.5 % (ref 34.0–46.6)
Hemoglobin: 13 g/dL (ref 11.1–15.9)
MCH: 32.3 pg (ref 26.6–33.0)
MCHC: 34.7 g/dL (ref 31.5–35.7)
MCV: 93 fL (ref 79–97)
Platelets: 262 10*3/uL (ref 150–450)
RBC: 4.03 x10E6/uL (ref 3.77–5.28)
RDW: 12.9 % (ref 12.3–15.4)
WBC: 6 10*3/uL (ref 3.4–10.8)

## 2018-02-15 LAB — TSH: TSH: 1.52 u[IU]/mL (ref 0.450–4.500)

## 2018-02-19 ENCOUNTER — Other Ambulatory Visit: Payer: Self-pay | Admitting: Cardiovascular Disease

## 2018-02-20 ENCOUNTER — Ambulatory Visit (HOSPITAL_COMMUNITY): Payer: BLUE CROSS/BLUE SHIELD | Attending: Cardiovascular Disease

## 2018-02-20 ENCOUNTER — Other Ambulatory Visit: Payer: Self-pay

## 2018-02-20 ENCOUNTER — Encounter: Payer: Self-pay | Admitting: Physician Assistant

## 2018-02-20 DIAGNOSIS — I10 Essential (primary) hypertension: Secondary | ICD-10-CM | POA: Diagnosis not present

## 2018-02-20 DIAGNOSIS — E785 Hyperlipidemia, unspecified: Secondary | ICD-10-CM | POA: Insufficient documentation

## 2018-02-20 DIAGNOSIS — I081 Rheumatic disorders of both mitral and tricuspid valves: Secondary | ICD-10-CM | POA: Insufficient documentation

## 2018-02-20 DIAGNOSIS — I341 Nonrheumatic mitral (valve) prolapse: Secondary | ICD-10-CM

## 2018-02-20 DIAGNOSIS — Z87891 Personal history of nicotine dependence: Secondary | ICD-10-CM | POA: Insufficient documentation

## 2018-02-20 DIAGNOSIS — R079 Chest pain, unspecified: Secondary | ICD-10-CM

## 2018-02-28 ENCOUNTER — Telehealth (HOSPITAL_COMMUNITY): Payer: Self-pay | Admitting: *Deleted

## 2018-02-28 NOTE — Telephone Encounter (Signed)
Patient given detailed instructions per Stress Test Requisition Sheet for test on 03/06/18 at 7:30.Patient Notified to arrive 30 minutes early, and that it is imperative to arrive on time for appointment to keep from having the test rescheduled.  Patient verbalized understanding. Katrina [name]

## 2018-03-06 ENCOUNTER — Encounter: Payer: Self-pay | Admitting: Physician Assistant

## 2018-03-06 ENCOUNTER — Ambulatory Visit (HOSPITAL_COMMUNITY): Payer: BLUE CROSS/BLUE SHIELD | Attending: Cardiovascular Disease

## 2018-03-06 ENCOUNTER — Ambulatory Visit (HOSPITAL_COMMUNITY): Payer: BLUE CROSS/BLUE SHIELD

## 2018-03-06 DIAGNOSIS — I341 Nonrheumatic mitral (valve) prolapse: Secondary | ICD-10-CM | POA: Insufficient documentation

## 2018-03-06 DIAGNOSIS — I493 Ventricular premature depolarization: Secondary | ICD-10-CM | POA: Insufficient documentation

## 2018-03-06 DIAGNOSIS — R079 Chest pain, unspecified: Secondary | ICD-10-CM

## 2018-03-06 DIAGNOSIS — E785 Hyperlipidemia, unspecified: Secondary | ICD-10-CM | POA: Insufficient documentation

## 2018-03-06 DIAGNOSIS — I10 Essential (primary) hypertension: Secondary | ICD-10-CM | POA: Insufficient documentation

## 2018-03-08 ENCOUNTER — Encounter: Payer: Self-pay | Admitting: Physician Assistant

## 2018-03-26 NOTE — Progress Notes (Signed)
Cardiology Office Note:    Date:  03/27/2018   ID:  Katrina [name], DOB 11-26-1960, MRN [id number]  PCP:  Lucretia Kern, DO  Cardiologist:  Mertie Moores, MD   Referring MD: Lucretia Kern, DO   Chief Complaint  Patient presents with  . Follow-up    HTN, Chest pain    History of Present Illness:    Katrina [name] is a 57 y.o. female with mitral valve prolapse, hypertension, hyperlipidemia, Raynaud's disease.  She was recently evaluated in June 2019 for chest pain and uncontrolled hypertension.  I adjusted her amlodipine.  Echocardiogram was obtained and demonstrated normal LV function, mild diastolic dysfunction, mitral valve prolapse with moderate mitral regurgitation.  Stress echocardiogram demonstrated normal findings.  There were frequent monomorphic PVCs in early recovery.     Katrina [name] returns for follow-up.  She is here alone. Since last seen, she has done well.  She has not had recurrent chest pain since taking antacid therapy.  She denies shortness of breath, syncope.  She has noted her blood pressure is better controlled and she feels better.  However, since increasing her Amlodipine, she feels like her legs, hands are swollen.    Prior CV studies:   The following studies were reviewed today:  Stress echocardiogram 02/14/2018 Normal clinical, ECG and echo response to stress; frequent monomorphic PVCs early recovery  Echo 02/14/2018 EF 30-16, grade 1 diastolic dysfunction, normal wall motion, mild mitral valve prolapse involving the posterior leaflet, moderate mitral regurgitation, mild tricuspid regurgitation  Transesophageal Echo 02/17/09 EF 01-09, MV with systolic bowing but no prolapse, mild MR  Echo 10/2006 EF 55-60, MVP, mild MR, trace TR  Past Medical History:  Diagnosis Date  . Anal fissure   . C. difficile diarrhea    04/2013  . Constipation    chronic her entire life  . Diverticulosis   . Family history of breast cancer in first  degree relative   . Frozen shoulder    history of bilateral frozen shoulder  . GERD (gastroesophageal reflux disease)   . Headache(784.0)   . History of abnormal Pap smear    S/P lazer conization  . History of stress test    Stress echocardiogram 7/19: Normal clinical, ECG and echo response to stress.  Note very frequent monomorphic PVCs especially in early recovery.  . Hyperlipidemia   . Hypertension   . Lupus anticoagulant disorder ([place])    refuted by hematologist in 2014  . Menopause   . Mitral regurgitation    Echo 6/19:  EF 60-65, no RWMA, Gr 1 DD, mild post leaflet MVP, mod MR, normal RVSF, mild TR  . Mitral valve prolapse    a. echo 01/2009 showed EF 32-35%, systolic bowing of mitral valve without prolapse.   . Osteopenia   . Raynaud disease    a. question false positive antiphospholipid panel-> sx later felt due to Raynaud itself (transient finger discoloration) rather than embolic phenomena.  . Vitamin D deficiency    Surgical Hx: The patient  has a past surgical history that includes Knee surgery (2006); Foot surgery (2007); and Wisdom tooth extraction (1981).   Current Medications: Current Meds  Medication Sig  . aspirin EC 81 MG tablet Take 81 mg by mouth daily.  Marland Kitchen atorvastatin (LIPITOR) 20 MG tablet Take 1 tablet (20 mg total) by mouth daily.  . Cholecalciferol (VITAMIN D3) 5000 units TABS Take 1 tablet by mouth 2 (two) times daily.  . hydrochlorothiazide (HYDRODIURIL) 25 MG tablet  TAKE 1 TABLET BY MOUTH DAILY.  Marland Kitchen loratadine (CLARITIN) 10 MG tablet Take 10 mg by mouth daily.  . potassium chloride SA (K-DUR,KLOR-CON) 20 MEQ tablet Take 1 tablet (20 mEq total) by mouth daily.  . [DISCONTINUED] amLODipine (NORVASC) 5 MG tablet Take 1 tablet (5 mg total) by mouth daily.     Allergies:   Cefuroxime   Social History   Tobacco Use  . Smoking status: Former Smoker    Packs/day: 1.00    Years: 6.00    Pack years: 6.00    Types: Cigarettes    Last attempt to quit:  07/02/1991    Years since quitting: 26.7  . Smokeless tobacco: Never Used  Substance Use Topics  . Alcohol use: Yes    Comment: 1-2 glasses of wine rarely  . Drug use: No     Family Hx: The patient's family history includes Anemia in her father; Atrial fibrillation in her father; Breast cancer in her maternal aunt and mother; Cancer in her maternal grandfather and paternal aunt; Colon cancer (age of onset: 61) in her mother; Heart disease in her father and maternal grandmother; Hypertension in her mother; Lung disease in her father; Stroke in her sister. There is no history of Heart attack.  ROS:   Please see the history of present illness.    ROS All other systems reviewed and are negative.   EKGs/Labs/Other Test Reviewed:    EKG:  EKG is not  ordered today.    Recent Labs: 05/24/2017: ALT 22 02/14/2018: BUN 9; Creatinine, Ser 0.77; Hemoglobin 13.0; Platelets 262; Potassium 3.7; Sodium 135; TSH 1.520   Recent Lipid Panel Lab Results  Component Value Date/Time   CHOL 217 (H) 05/24/2017 10:11 AM   TRIG 60 05/24/2017 10:11 AM   HDL 99 05/24/2017 10:11 AM   CHOLHDL 2.2 05/24/2017 10:11 AM   CHOLHDL 2.4 08/18/2016 08:35 AM   LDLCALC 106 (H) 05/24/2017 10:11 AM   LDLDIRECT 144.4 12/15/2010 08:38 AM    Physical Exam:    VS:  BP 130/76   Pulse 92   Ht 5\' 6"  (1.676 m)   Wt 140 lb 1.9 oz (63.6 kg)   SpO2 98%   BMI 22.62 kg/m     Wt Readings from Last 3 Encounters:  03/27/18 140 lb 1.9 oz (63.6 kg)  02/14/18 143 lb (64.9 kg)  05/29/17 136 lb 9.6 oz (62 kg)     Physical Exam  Constitutional: She is oriented to person, place, and time. She appears well-developed and well-nourished. No distress.  HENT:  Head: Normocephalic and atraumatic.  Eyes: No scleral icterus.  Neck: No JVD present.  Cardiovascular: Normal rate and regular rhythm.  No murmur heard. Pulmonary/Chest: Effort normal. She has no rales.  Abdominal: Soft. She exhibits no distension.  Musculoskeletal: She  exhibits no edema.  Neurological: She is alert and oriented to person, place, and time.  Skin: Skin is warm and dry.  Psychiatric: She has a normal mood and affect.    ASSESSMENT & PLAN:    Essential hypertension  Her blood pressure is much better controlled.  However, she is having some side effects to the higher dose of Amlodipine.  I considered discontinuing Amlodipine, but this did help her Raynaud's symptoms.  I have suggested she reduce her Amlodipine back to 2.5 mg Once daily and add Lisinopril 10 mg Once daily.   -Reduce Amlodipine to 2.5 mg QD  -Start Lisinopril 10 mg QD  -BMET 2 weeks after starting Lisinopril  Chest pain, unspecified type Resolved.  Recent stress test normal.  No further cardiac workup.  Mitral valve insufficiency, unspecified etiology  Mitral valve prolapse with mod mitral regurgitation on recent echocardiogram.  Plan repeat echocardiogram in 1 year.   Mixed hyperlipidemia  Continue statin.  - Plan: Lipid Profile, Hepatic function panel     Dispo:  Return in about 6 months (around 09/27/2018) for Routine Follow Up, w/ Dr. Acie Fredrickson.   Medication Adjustments/Labs and Tests Ordered: Current medicines are reviewed at length with the patient today.  Concerns regarding medicines are outlined above.  Tests Ordered: Orders Placed This Encounter  Procedures  . Basic Metabolic Panel (BMET)  . Lipid Profile  . Hepatic function panel  . ECHOCARDIOGRAM COMPLETE   Medication Changes: Meds ordered this encounter  Medications  . amLODipine (NORVASC) 2.5 MG tablet    Sig: Take 1 tablet (2.5 mg total) by mouth daily.    Dispense:  180 tablet    Refill:  3    DOSE CHANGE  . lisinopril (PRINIVIL,ZESTRIL) 10 MG tablet    Sig: Take 1 tablet (10 mg total) by mouth daily.    Dispense:  90 tablet    Refill:  3    Signed, Richardson Dopp, PA-C  03/27/2018 11:05 PM    [facility], [place], [place]  [address] Phone: [phone];  Fax: [phone]

## 2018-03-27 ENCOUNTER — Encounter: Payer: Self-pay | Admitting: Physician Assistant

## 2018-03-27 ENCOUNTER — Ambulatory Visit: Payer: BLUE CROSS/BLUE SHIELD | Admitting: Physician Assistant

## 2018-03-27 VITALS — BP 130/76 | HR 92 | Ht 66.0 in | Wt 140.1 lb

## 2018-03-27 DIAGNOSIS — I340 Nonrheumatic mitral (valve) insufficiency: Secondary | ICD-10-CM | POA: Diagnosis not present

## 2018-03-27 DIAGNOSIS — R079 Chest pain, unspecified: Secondary | ICD-10-CM

## 2018-03-27 DIAGNOSIS — I10 Essential (primary) hypertension: Secondary | ICD-10-CM

## 2018-03-27 DIAGNOSIS — E782 Mixed hyperlipidemia: Secondary | ICD-10-CM

## 2018-03-27 MED ORDER — LISINOPRIL 10 MG PO TABS: 10.0000 mg | ORAL_TABLET | Freq: Every day | ORAL | 3 refills | Status: DC

## 2018-03-27 MED ORDER — AMLODIPINE BESYLATE 2.5 MG PO TABS: 2.5000 mg | ORAL_TABLET | Freq: Every day | ORAL | 3 refills | Status: DC

## 2018-03-27 NOTE — Patient Instructions (Addendum)
Medication Instructions:  1. START LISINOPRIL 10 MG DAILY; NEW RX HAS BEEN SENT IN  2. DECREASE NORVASC TO 2.5 MG DAILY; NEW RX HAS BEEN SENT IN   Labwork: BMET TO BE DONE 2 WEEKS AFTER YOU HAVE MADE THE MED CHANGES; ALSO FASTING LIPID AND LIVER PANEL PER DR. Acie Fredrickson   Testing/Procedures: ECHO TO BE DONE IN 03/2019  Follow-Up: 6 MONTHS WITH DR. Acie Fredrickson   Any Other Special Instructions Will Be Listed Below (If Applicable).     If you need a refill on your cardiac medications before your next appointment, please call your pharmacy.

## 2018-03-28 MED ORDER — AMLODIPINE BESYLATE 2.5 MG PO TABS: 2.5000 mg | ORAL_TABLET | Freq: Every day | ORAL | 3 refills | Status: DC

## 2018-03-28 NOTE — Addendum Note (Signed)
Addended by: Michae Kava on: 03/28/2018 08:52 AM   Modules accepted: Orders

## 2018-04-20 ENCOUNTER — Other Ambulatory Visit: Payer: BLUE CROSS/BLUE SHIELD | Admitting: *Deleted

## 2018-04-20 DIAGNOSIS — I10 Essential (primary) hypertension: Secondary | ICD-10-CM

## 2018-04-20 LAB — BASIC METABOLIC PANEL
BUN/Creatinine Ratio: 11 (ref 9–23)
BUN: 10 mg/dL (ref 6–24)
CO2: 24 mmol/L (ref 20–29)
Calcium: 10 mg/dL (ref 8.7–10.2)
Chloride: 95 mmol/L — ABNORMAL LOW (ref 96–106)
Creatinine, Ser: 0.88 mg/dL (ref 0.57–1.00)
GFR calc Af Amer: 84 mL/min/{1.73_m2} (ref >59)
GFR calc non Af Amer: 73 mL/min/{1.73_m2} (ref >59)
Glucose: 91 mg/dL (ref 65–99)
Potassium: 4.3 mmol/L (ref 3.5–5.2)
Sodium: 135 mmol/L (ref 134–144)

## 2018-05-02 ENCOUNTER — Other Ambulatory Visit: Payer: Self-pay | Admitting: Cardiovascular Disease

## 2018-05-09 DIAGNOSIS — Z01419 Encounter for gynecological examination (general) (routine) without abnormal findings: Secondary | ICD-10-CM | POA: Diagnosis not present

## 2018-05-09 DIAGNOSIS — Z1231 Encounter for screening mammogram for malignant neoplasm of breast: Secondary | ICD-10-CM | POA: Diagnosis not present

## 2018-05-09 DIAGNOSIS — Z6822 Body mass index (BMI) 22.0-22.9, adult: Secondary | ICD-10-CM | POA: Diagnosis not present

## 2018-07-04 ENCOUNTER — Other Ambulatory Visit: Payer: Self-pay | Admitting: Cardiovascular Disease

## 2018-09-17 ENCOUNTER — Emergency Department (HOSPITAL_COMMUNITY): Payer: BLUE CROSS/BLUE SHIELD

## 2018-09-17 ENCOUNTER — Emergency Department (HOSPITAL_COMMUNITY)
Admission: EM | Admit: 2018-09-17 | Discharge: 2018-09-17 | Disposition: A | Discharge status: Home / Self Care | Payer: BLUE CROSS/BLUE SHIELD | Attending: Emergency Medicine | Admitting: Emergency Medicine

## 2018-09-17 ENCOUNTER — Encounter (HOSPITAL_COMMUNITY): Payer: Self-pay | Admitting: *Deleted

## 2018-09-17 DIAGNOSIS — Z79899 Other long term (current) drug therapy: Secondary | ICD-10-CM | POA: Insufficient documentation

## 2018-09-17 DIAGNOSIS — R55 Syncope and collapse: Secondary | ICD-10-CM

## 2018-09-17 DIAGNOSIS — I10 Essential (primary) hypertension: Secondary | ICD-10-CM | POA: Insufficient documentation

## 2018-09-17 DIAGNOSIS — I499 Cardiac arrhythmia, unspecified: Secondary | ICD-10-CM | POA: Diagnosis not present

## 2018-09-17 DIAGNOSIS — I959 Hypotension, unspecified: Secondary | ICD-10-CM | POA: Insufficient documentation

## 2018-09-17 DIAGNOSIS — Z7982 Long term (current) use of aspirin: Secondary | ICD-10-CM | POA: Insufficient documentation

## 2018-09-17 DIAGNOSIS — Z87891 Personal history of nicotine dependence: Secondary | ICD-10-CM | POA: Insufficient documentation

## 2018-09-17 DIAGNOSIS — R42 Dizziness and giddiness: Secondary | ICD-10-CM | POA: Diagnosis not present

## 2018-09-17 DIAGNOSIS — I951 Orthostatic hypotension: Secondary | ICD-10-CM | POA: Diagnosis present

## 2018-09-17 DIAGNOSIS — E876 Hypokalemia: Secondary | ICD-10-CM | POA: Insufficient documentation

## 2018-09-17 DIAGNOSIS — R231 Pallor: Secondary | ICD-10-CM | POA: Diagnosis not present

## 2018-09-17 DIAGNOSIS — R531 Weakness: Secondary | ICD-10-CM | POA: Diagnosis not present

## 2018-09-17 HISTORY — DX: Orthostatic hypotension: I95.1

## 2018-09-17 HISTORY — DX: Syncope and collapse: R55

## 2018-09-17 LAB — MAGNESIUM: Magnesium: 2 mg/dL (ref 1.7–2.4)

## 2018-09-17 LAB — BASIC METABOLIC PANEL
Anion gap: 11 (ref 5–15)
BUN: 8 mg/dL (ref 6–20)
CO2: 24 mmol/L (ref 22–32)
Calcium: 8.7 mg/dL — ABNORMAL LOW (ref 8.9–10.3)
Chloride: 100 mmol/L (ref 98–111)
Creatinine, Ser: 0.88 mg/dL (ref 0.44–1.00)
GFR calc Af Amer: >60 mL/min (ref >60)
GFR calc non Af Amer: >60 mL/min (ref >60)
Glucose, Bld: 129 mg/dL — ABNORMAL HIGH (ref 70–99)
Potassium: 2.9 mmol/L — ABNORMAL LOW (ref 3.5–5.1)
Sodium: 135 mmol/L (ref 135–145)

## 2018-09-17 LAB — CBC
HCT: 35.2 % — ABNORMAL LOW (ref 36.0–46.0)
Hemoglobin: 11.8 g/dL — ABNORMAL LOW (ref 12.0–15.0)
MCH: 33.2 pg (ref 26.0–34.0)
MCHC: 33.5 g/dL (ref 30.0–36.0)
MCV: 99.2 fL (ref 80.0–100.0)
Platelets: 254 10*3/uL (ref 150–400)
RBC: 3.55 MIL/uL — ABNORMAL LOW (ref 3.87–5.11)
RDW: 11.5 % (ref 11.5–15.5)
WBC: 5.7 10*3/uL (ref 4.0–10.5)
nRBC: 0 % (ref 0.0–0.2)

## 2018-09-17 LAB — I-STAT TROPONIN, ED: Troponin i, poc: 0 ng/mL (ref 0.00–0.08)

## 2018-09-17 LAB — PHOSPHORUS: Phosphorus: 2 mg/dL — ABNORMAL LOW (ref 2.5–4.6)

## 2018-09-17 MED ORDER — LACTATED RINGERS IV BOLUS: 1000.0000 mL | Freq: Once | INTRAVENOUS | Status: DC

## 2018-09-17 MED ORDER — POTASSIUM CHLORIDE CRYS ER 20 MEQ PO TBCR
40.0000 meq | EXTENDED_RELEASE_TABLET | Freq: Once | ORAL | Status: AC
  Administered 2018-09-17: 40 meq via ORAL
  Filled 2018-09-17: qty 2

## 2018-09-17 MED ORDER — SODIUM CHLORIDE 0.9% FLUSH
3.0000 mL | Freq: Once | INTRAVENOUS | Status: AC
  Administered 2018-09-17: 3 mL via INTRAVENOUS

## 2018-09-17 MED ORDER — POTASSIUM CHLORIDE 10 MEQ/100ML IV SOLN
10.0000 meq | Freq: Once | INTRAVENOUS | Status: DC
  Filled 2018-09-17: qty 100

## 2018-09-17 NOTE — ED Notes (Signed)
Patient transported to X-ray 

## 2018-09-17 NOTE — ED Triage Notes (Signed)
Pt in via EMS to triage, c/o unexplained orthostatic hypotension and dizziness, has felt weak all weekend but was able to still perform ADLs, suddenly today she developed hypotension while sitting at work, pt BP normalizes while laying, drops in to the 80s when she is sat up, alert and oriented on arrival, denies pain

## 2018-09-17 NOTE — Consult Note (Addendum)
Cardiology Consultation:   Patient ID: JESSAMINE BARCIA; [id number]; 02/19/1961   Admit date: 09/17/2018 Date of Consult: 09/17/2018  Primary Care Provider: Lucretia Kern, DO Primary Cardiologist: Mertie Moores, MD 06/18/2017 Richardson Dopp, [facility] 03/27/2018 Primary Electrophysiologist:  None   Patient Profile:   Katrina [name] is a 58 y.o. female with a hx of MVP, HTN, HLD, Raynaud's dz, PVCs, GERD, who is being seen today for the evaluation of near-syncope at the request of Dr Vallery Ridge.  History of Present Illness:   Katrina [name] was feeling a little tired and a little dizzy this weekend. She was doing a little sneezing and coughing, no fevers or chills. Does not feel she was dehydrated or drinking less fluids than usual.   Today, she got up, drank coffee and walked the dogs before breakfast/meds. She did not eat breakfast, but took meds, got dressed and went to work.   Got a little light-headed, thought it was the amlodipine. She ate a good lunch. Drank liquids but not that much.   Right after lunch, she felt very weak, unable to walk unaided. Felt lights were very bright. No palpitations, no chest pain or SOB. Was sweating, very nauseated and weak.   911 called by husband, BP 80/30 by EMS. Once she laid down, BP improved. She got IVF en route to [facility]. By arrival, felt less shaky and sweaty, still nauseated. The nausea eventually resolved. SBP had improved but was still low-normal.   Her K+ was 2.9, no recent checks. Is awaiting supplement.   She bruises easily. The rx has helped with the Raynauds.   Past Medical History:  Diagnosis Date  . Anal fissure   . C. difficile diarrhea    04/2013  . Constipation    chronic her entire life  . Diverticulosis   . Family history of breast cancer in first degree relative   . Frozen shoulder    history of bilateral frozen shoulder  . GERD (gastroesophageal reflux disease)   . Headache(784.0)   . History of  abnormal Pap smear    S/P lazer conization  . History of stress test    Stress echocardiogram 7/19: Normal clinical, ECG and echo response to stress.  Note very frequent monomorphic PVCs especially in early recovery.  . Hyperlipidemia   . Hypertension   . Lupus anticoagulant disorder ([place])    refuted by hematologist in 2014  . Menopause   . Mitral regurgitation    Echo 6/19:  EF 60-65, no RWMA, Gr 1 DD, mild post leaflet MVP, mod MR, normal RVSF, mild TR  . Mitral valve prolapse    a. echo 01/2009 showed EF 66-44%, systolic bowing of mitral valve without prolapse.   . Near syncope 09/17/2018  . Orthostatic hypotension 09/17/2018  . Osteopenia   . Raynaud disease    a. question false positive antiphospholipid panel-> sx later felt due to Raynaud itself (transient finger discoloration) rather than embolic phenomena.  . Vitamin D deficiency     Past Surgical History:  Procedure Laterality Date  . FOOT SURGERY  2007   left  . KNEE SURGERY  2006   left  . [place]     Prior to Admission medications   Medication Sig Start Date End Date Taking? Authorizing Provider  amLODipine (NORVASC) 2.5 MG tablet Take 1 tablet (2.5 mg total) by mouth daily. 03/28/18 06/26/18  Richardson Dopp T, PA-C  aspirin EC 81 MG tablet Take 81 mg by mouth daily.  [provider]  atorvastatin (LIPITOR) 20 MG tablet Take 1 tablet (20 mg total) by mouth daily. 02/19/18   Richardson Dopp T, PA-C  Cholecalciferol (VITAMIN D3) 5000 units TABS Take 1 tablet by mouth 2 (two) times daily.    [provider]  hydrochlorothiazide (HYDRODIURIL) 25 MG tablet TAKE 1 TABLET BY MOUTH DAILY. 07/04/18   Nahser, Wonda Cheng, MD  lisinopril (PRINIVIL,ZESTRIL) 10 MG tablet Take 1 tablet (10 mg total) by mouth daily. 03/27/18 06/25/18  Richardson Dopp T, PA-C  loratadine (CLARITIN) 10 MG tablet Take 10 mg by mouth daily.    [provider]  potassium chloride SA (K-DUR,KLOR-CON) 20 MEQ tablet  Take 1 tablet (20 mEq total) by mouth daily. 05/02/18   Nahser, Wonda Cheng, MD    Inpatient Medications: Scheduled Meds: . potassium chloride  40 mEq Oral Once  . sodium chloride flush  3 mL Intravenous Once   Continuous Infusions: . lactated ringers    . potassium chloride     PRN Meds:   Allergies:    Allergies  Allergen Reactions  . Cefuroxime Diarrhea and Nausea And Vomiting    Caused C Dif    Social History:   Social History   Socioeconomic History  . Marital status: Married    Spouse name: Not on file  . Number of children: 3  . Years of education: Not on file  . Highest education level: Not on file  Occupational History  . 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 Financial resource strain: Not on file  . Food insecurity:    Worry: Not on file    Inability: Not on file  . Transportation needs:    Medical: Not on file    Non-medical: Not on file  Tobacco Use  . Smoking status: Former Smoker    Packs/day: 1.00    Years: 6.00    Pack years: 6.00    Types: Cigarettes    Last attempt to quit: 07/02/1991    Years since quitting: 27.2  . Smokeless tobacco: Never Used  Substance and Sexual Activity  . Alcohol use: Yes    Comment: 1-2 glasses of wine rarely  . Drug use: No  . Sexual activity: Yes    Partners: Male  Lifestyle  . Physical activity:    Days per week: Not on file    Minutes per session: Not on file  . Stress: Not on file  Relationships  . Social connections:    Talks on phone: Not on file    Gets together: Not on file    Attends religious service: Not on file    Active member of club or organization: Not on file    Attends meetings of clubs or organizations: Not on file    Relationship status: Not on file  . Intimate partner violence:    Fear of current or ex partner: Not on file    Emotionally abused: Not on file    Physically abused: Not on file    Forced sexual activity: Not on file    Other Topics Concern  . 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 Colon cancer Mother 57  . Breast cancer Mother   . Hypertension Mother   . Anemia Father   . Heart disease Father        CHF  . Lung disease Father   . Atrial fibrillation Father   . Breast cancer Maternal Aunt   . Cancer Paternal Aunt        brain  . Heart disease Maternal Grandmother   . Cancer Maternal Grandfather        throat  . Stroke Sister   . Heart attack Neg Hx    Family Status:  Family Status  Relation Name Status  . Mother  Deceased  . Father  Alive  . Mat Aunt  (Not Specified)  . Ethlyn Daniels  (Not Specified)  . MGM  Deceased  . MGF  Deceased  . Sister  (Not Specified)  . PGM  Deceased  . PGF  Deceased  . Neg Hx  (Not Specified)    ROS:  Please see the history of present illness.  All other ROS reviewed and negative.     Physical Exam/Data:   Vitals:   09/17/18 1545 09/17/18 1600 09/17/18 1615 09/17/18 1645  BP: 104/63 122/76 110/65 110/74  Pulse: 77 94 86 86  Resp: 10 (!) 22 13 19   Temp:      SpO2: 98% 100% 97% 100%   No intake or output data in the 24 hours ending 09/17/18 1714 There were no vitals filed for this visit. There is no height or weight on file to calculate BMI.  General:  Well nourished, well developed, in no acute distress HEENT: normal Lymph: no adenopathy Neck: no JVD Endocrine:  No thryomegaly Vascular: No carotid bruits; 4/4 extremity pulses 2+, without bruits  Cardiac:  normal S1, S2; RRR; soft murmur  Lungs:  clear to auscultation bilaterally, no wheezing, rhonchi or rales   Abd: soft, nontender, no hepatomegaly  Ext: no edema Musculoskeletal:  No deformities, BUE and BLE strength normal and equal Skin: warm and dry  Neuro:  CNs 2-12 intact, no focal abnormalities noted Psych:  Normal affect   EKG:  The EKG was personally reviewed and demonstrates:  SR, no acute ischemic changes Telemetry:  Telemetry was personally reviewed and demonstrates:  SR  Relevant CV Studies:  ECHO: ordered 03/27/2018 ECHO: 02/20/2018 - Left ventricle: The cavity size was normal. Systolic function was   normal. The estimated ejection fraction was in the range of 60%   to 65%. Wall motion was normal; there were no regional wall   motion abnormalities. Doppler parameters are consistent with   abnormal left ventricular relaxation (grade 1 diastolic   dysfunction). Doppler parameters are consistent with   indeterminate ventricular filling pressure. - Aortic valve: Transvalvular velocity was within the normal range.   There was no stenosis. There was no regurgitation. - Mitral valve: Mild prolapse, involving the posterior leaflet.   Transvalvular velocity was within the normal range. There was no   evidence for stenosis. There was moderate regurgitation. - Right ventricle: The cavity size was normal. Wall thickness was   normal. Systolic function was normal. - Atrial septum: No defect or patent foramen ovale was identified. - Tricuspid valve:  There was mild regurgitation. - Pulmonary arteries: Systolic pressure was within the normal   range.  STRESS ECHO: 9" Exercise, 115% MPHR, no chest pain  Laboratory Data:  Chemistry Recent Labs  Lab 09/17/18 1409  NA 135  K 2.9*  CL 100  CO2 24  GLUCOSE 129*  BUN 8  CREATININE 0.88  CALCIUM 8.7*  GFRNONAA >60  GFRAA >60  ANIONGAP 11    Lab Results  Component Value Date   ALT 22 05/24/2017   AST 24 05/24/2017   ALKPHOS 46 05/24/2017   BILITOT 0.4 05/24/2017   Hematology Recent Labs  Lab 09/17/18 1409  WBC 5.7  RBC  3.55*  HGB 11.8*  HCT 35.2*  MCV 99.2  MCH 33.2  MCHC 33.5  RDW 11.5  PLT 254   Cardiac EnzymesNo results for input(s): TROPONINI in the last 168 hours.  Recent Labs  Lab 09/17/18 1433  TROPIPOC 0.00    TSH:  Lab Results  Component Value Date   TSH 1.520 02/14/2018   Lipids: Lab Results  Component Value Date   CHOL 217 (H) 05/24/2017   HDL 99 05/24/2017   LDLCALC 106 (H) 05/24/2017   LDLDIRECT 144.4 12/15/2010   TRIG 60 05/24/2017   CHOLHDL 2.2 05/24/2017   HgbA1c: Lab Results  Component Value Date   HGBA1C 5.5 10/14/2016   Magnesium: No results found for: MG   Radiology/Studies:  Dg Chest 2 View  Result Date: 09/17/2018 CLINICAL DATA:  Weakness with hypotension and nausea today. EXAM: CHEST - 2 VIEW COMPARISON:  11/07/2007 FINDINGS: Lungs are adequately inflated and otherwise clear. Cardiomediastinal silhouette and remainder of the exam is unchanged. IMPRESSION: No active cardiopulmonary disease. Electronically Signed   By: Marin Olp M.D.   On: 09/17/2018 15:34    Assessment and Plan:   Principal Problem: 1.  Near syncope - most likely 2nd orthostatic hypotension - follow for sx, but do not think further inpatient eval needed  Active Problems: 2.  Orthostatic hypotension - by her description, very orthostatic pta - sx improved w/ IVF - BUN/Cr ok, but K+ very low, likely 2nd HCTZ - Would decrease the HCTZ to 12.5 mg qd, continue Kdur at current dose, f/u in 1 month w/ BMET.  3. Hypokalemia - apparently very sensitive to the HCTZ - compliant w/ supplement, also eats K+ rich foods. - supplement w/ a total of 80 meq today - if HCTZ is decreased, continue current supplement - if HCTZ is unchanged, increase it.  Plan: Possible d/c today  For questions or updates, please contact [place] Please consult [email or web address] for contact info under Cardiology/STEMI.   Signed, Rosaria Ferries, PA-C  09/17/2018 5:14 PM   Attending Note:   The patient  was seen and examined.  Agree with assessment and plan as noted above.  Changes made to the above note as needed.  Patient seen and independently examined with Rhnoda Barrett, PA .   We discussed all aspects of the encounter. I agree with the assessment and plan as stated above.  1.  Syncope/presyncope: Katrina [name] is seen today following an episode of presyncope.  She took both her HCTZ and her low-dose amlodipine this morning.  She really did not eat much breakfast.  She ate lunch but very soon after that became very presyncopal.  She had tunnel vision and ringing in her ears.  EMS was called.  Her pressure was quite low. An IV was started and she received a liter of normal saline.  Her blood  pressure normalized after that. She denies any chest pain or shortness of breath.  In the ER her hemodynamics are stable.  Her lungs are clear.  She has a regular rate.  She has a soft late systolic murmur radiating to the axilla ( c/w MVP)  No leg edema.  I suspect her presyncope was due to hypovolemia.  We will decrease the HCTZ to 12.5 mg a day.  Continue potassium chloride 20 mEq a day. I will see her in the office in 3 to 4 weeks for a follow-up office visit.  We will recheck a basic metabolic profile and hemoglobin at that time.  2.  Hypokalemia: Her potassium was 2.9 today.  It has been in the normal range and was normal back in August.  I suspect some of this hypokalemia is due to dilution from getting the liter of normal saline.  She may have some degree of hypokalemia.  We will continue the current dose of potassium supplement and reduce the dose of HCTZ.  Recheck this again in several weeks.  3.  Anemia: Her hemoglobin is 11.8.  This is down slightly from her previous hemoglobin several months ago.  This also may be due to hemodilution from the 1 L of normal saline she just received.  She denies having any blood in her stool or blood in her urine.  She is no longer having menstrual periods.   Will  follow up with this    I have spent a total of 40 minutes with patient reviewing hospital  notes , telemetry, EKGs, labs and examining patient as well as establishing an assessment and plan that was discussed with the patient. > 50% of time was spent in direct patient care.    Thayer Headings, Brooke Bonito., MD, [facility] 09/17/2018, 5:53 PM [address],  [place] Pager [phone]

## 2018-09-17 NOTE — ED Provider Notes (Signed)
[facility] EMERGENCY DEPARTMENT Provider Note   CSN: [id number] Arrival date & time: 09/17/18  1404     History   Chief Complaint Chief Complaint  Patient presents with  . Hypotension resolved    HPI Katrina [name] is a 58 y.o. female.  HPI Patient reports this weekend she did not feel as well as normal.  She had a feeling of slight lightheadedness and fatigue.  Patient thought that the symptoms were likely due to an elevation in her blood pressure.  She however did not measure her pressure over the weekend to know if there was any actual elevation.  She otherwise carried on with usual activities.  She denies she was having any visual problems, headaches, chest pain or shortness of breath.  Today, patient was at a work-related meeting.  She had already had lunch.  She reports she fairly abruptly started to get very dizzy feeling and weak.  Her husband reports that she was extremely pale in appearance.  She reports she felt that she was close to passing out but did not lose consciousness.  She denies that she was having chest pain or headache.  She reports quite suddenly her eyes felt very light sensitive.  EMS was called.  Reportedly they checked her blood pressure and it was 80s over 30s.  Patient reports that she feels improved at this time.  She does not have any chest pain, headache, blurred vision.  She reports she still feels a bit fatigued. Past Medical History:  Diagnosis Date  . Anal fissure   . C. difficile diarrhea    04/2013  . Constipation    chronic her entire life  . Diverticulosis   . Family history of breast cancer in first degree relative   . Frozen shoulder    history of bilateral frozen shoulder  . GERD (gastroesophageal reflux disease)   . Headache(784.0)   . History of abnormal Pap smear    S/P lazer conization  . History of stress test    Stress echocardiogram 7/19: Normal clinical, ECG and echo response to stress.  Note very  frequent monomorphic PVCs especially in early recovery.  . Hyperlipidemia   . Hypertension   . Lupus anticoagulant disorder ([place])    refuted by hematologist in 2014  . Menopause   . Mitral regurgitation    Echo 6/19:  EF 60-65, no RWMA, Gr 1 DD, mild post leaflet MVP, mod MR, normal RVSF, mild TR  . Mitral valve prolapse    a. echo 01/2009 showed EF 22-02%, systolic bowing of mitral valve without prolapse.   . Near syncope 09/17/2018  . Orthostatic hypotension 09/17/2018  . Osteopenia   . Raynaud disease    a. question false positive antiphospholipid panel-> sx later felt due to Raynaud itself (transient finger discoloration) rather than embolic phenomena.  . Vitamin D deficiency     Patient Active Problem List   Diagnosis Date Noted  . Near syncope 09/17/2018  . Orthostatic hypotension 09/17/2018  . Raynaud's disease 07/12/2016  . Hyperlipidemia 12/01/2015  . History of meniscal tear 01/12/2015  . Migraine 12/24/2013  . Constipation 06/04/2012  . MVP (mitral valve prolapse)   . Benign essential HTN 05/18/2007  . GERD 05/18/2007  . Disorder of bone and cartilage 05/18/2007    Past Surgical History:  Procedure Laterality Date  . FOOT SURGERY  2007   left  . KNEE SURGERY  2006   left  . [place]  OB History   No obstetric history on file.      Home Medications    Prior to Admission medications   Medication Sig Start Date End Date Taking? Authorizing Provider  amLODipine (NORVASC) 2.5 MG tablet Take 1 tablet (2.5 mg total) by mouth daily. 03/28/18 06/26/18  Richardson Dopp T, PA-C  aspirin EC 81 MG tablet Take 81 mg by mouth daily.    [provider]  atorvastatin (LIPITOR) 20 MG tablet Take 1 tablet (20 mg total) by mouth daily. 02/19/18   Richardson Dopp T, PA-C  Cholecalciferol (VITAMIN D3) 5000 units TABS Take 1 tablet by mouth 2 (two) times daily.    [provider]  hydrochlorothiazide (HYDRODIURIL) 25 MG tablet TAKE 1 TABLET  BY MOUTH DAILY. 07/04/18   Nahser, Wonda Cheng, MD  lisinopril (PRINIVIL,ZESTRIL) 10 MG tablet Take 1 tablet (10 mg total) by mouth daily. 03/27/18 06/25/18  Richardson Dopp T, PA-C  loratadine (CLARITIN) 10 MG tablet Take 10 mg by mouth daily.    [provider]  potassium chloride SA (K-DUR,KLOR-CON) 20 MEQ tablet Take 1 tablet (20 mEq total) by mouth daily. 05/02/18   Nahser, Wonda Cheng, MD    Family History Family History  Problem Relation Age of Onset  . Colon cancer Mother 8  . Breast cancer Mother   . Hypertension Mother   . Anemia Father   . Heart disease Father        CHF  . Lung disease Father   . Atrial fibrillation Father   . Breast cancer Maternal Aunt   . Cancer Paternal Aunt        brain  . Heart disease Maternal Grandmother   . Cancer Maternal Grandfather        throat  . Stroke Sister   . Heart attack Neg Hx     Social History Social History   Tobacco Use  . Smoking status: Former Smoker    Packs/day: 1.00    Years: 6.00    Pack years: 6.00    Types: Cigarettes    Last attempt to quit: 07/02/1991    Years since quitting: 27.2  . Smokeless tobacco: Never Used  Substance Use Topics  . Alcohol use: Yes    Comment: 1-2 glasses of wine rarely  . Drug use: No     Allergies   Cefuroxime   Review of Systems Review of Systems 10 Systems reviewed and are negative for acute change except as noted in the HPI.   Physical Exam Updated Vital Signs BP 115/67   Pulse 75   Temp 97.7 F (36.5 C)   Resp 14   SpO2 98%   Physical Exam Constitutional:      Appearance: She is well-developed.  HENT:     Head: Normocephalic and atraumatic.     Nose: Nose normal.     Mouth/Throat:     Mouth: Mucous membranes are moist.  Eyes:     Extraocular Movements: Extraocular movements intact.     Conjunctiva/sclera: Conjunctivae normal.     Pupils: Pupils are equal, round, and reactive to light.  Neck:     Musculoskeletal: Neck supple.     Comments: No  thyromegaly. Cardiovascular:     Rate and Rhythm: Normal rate and regular rhythm.     Pulses: Normal pulses.     Heart sounds: Normal heart sounds.  Pulmonary:     Effort: Pulmonary effort is normal.     Breath sounds: Normal breath sounds.  Abdominal:  General: Bowel sounds are normal. There is no distension.     Palpations: Abdomen is soft.     Tenderness: There is no abdominal tenderness.  Musculoskeletal: Normal range of motion.        General: No tenderness.     Right lower leg: No edema.     Left lower leg: No edema.  Lymphadenopathy:     Cervical: No cervical adenopathy.  Skin:    General: Skin is warm and dry.  Neurological:     General: No focal deficit present.     Mental Status: She is alert and oriented to person, place, and time.     GCS: GCS eye subscore is 4. GCS verbal subscore is 5. GCS motor subscore is 6.     Motor: No weakness.     Coordination: Coordination normal.  Psychiatric:        Mood and Affect: Mood normal.      ED Treatments / Results  Labs (all labs ordered are listed, but only abnormal results are displayed) Labs Reviewed  BASIC METABOLIC PANEL - Abnormal; Notable for the following components:      Result Value   Potassium 2.9 (*)    Glucose, Bld 129 (*)    Calcium 8.7 (*)    All other components within normal limits  CBC - Abnormal; Notable for the following components:   RBC 3.55 (*)    Hemoglobin 11.8 (*)    HCT 35.2 (*)    All other components within normal limits  MAGNESIUM  PHOSPHORUS  TSH  URINALYSIS, ROUTINE W REFLEX MICROSCOPIC  RAPID URINE DRUG SCREEN, HOSP PERFORMED  I-STAT TROPONIN, ED    EKG EKG Interpretation  Date/Time:  Monday September 17 2018 14:23:45 EST Ventricular Rate:  69 PR Interval:  182 QRS Duration: 84 QT Interval:  434 QTC Calculation: 465 R Axis:   69 Text Interpretation:  Normal sinus rhythm Nonspecific ST and T wave abnormality Abnormal ECG no sig change from previous Confirmed by  Charlesetta Shanks [phone]) on 09/17/2018 3:21:15 PM   Radiology Dg Chest 2 View  Result Date: 09/17/2018 CLINICAL DATA:  Weakness with hypotension and nausea today. EXAM: CHEST - 2 VIEW COMPARISON:  11/07/2007 FINDINGS: Lungs are adequately inflated and otherwise clear. Cardiomediastinal silhouette and remainder of the exam is unchanged. IMPRESSION: No active cardiopulmonary disease. Electronically Signed   By: Marin Olp M.D.   On: 09/17/2018 15:34    Procedures Procedures (including critical care time)  Medications Ordered in ED Medications  sodium chloride flush (NS) 0.9 % injection 3 mL (has no administration in time range)  lactated ringers bolus 1,000 mL (has no administration in time range)  potassium chloride SA (K-DUR,KLOR-CON) CR tablet 40 mEq (40 mEq Oral Given 09/17/18 1726)     Initial Impression / Assessment and Plan / ED Course  I have reviewed the triage vital signs and the nursing notes.  Pertinent labs & imaging results that were available during my care of the patient were reviewed by me and considered in my medical decision making (see chart for details).    Patient is alert and clinically well in appearance.  Symptoms have resolved.  She had an episode of hypotension documented with near syncope.  Patient denies she was having active chest pain or headache at that time.  She had no neurologic dysfunction.  At this time, patient does have hypokalemia.  Potassium has been supplemented.  Cardiology consult has been done in the emergency department.  Patient will  half her dose of hydrochlorothiazide and have close follow-up in the office for response to treatment.  Return instructions included.  Final Clinical Impressions(s) / ED Diagnoses   Final diagnoses:  Near syncope  Hypotension, unspecified hypotension type  Hypokalemia    ED Discharge Orders    None       Charlesetta Shanks, MD 09/17/18 1750

## 2018-09-17 NOTE — ED Notes (Signed)
Patient verbalizes understanding of discharge instructions. Opportunity for questioning and answers were provided. 

## 2018-09-17 NOTE — Discharge Instructions (Addendum)
1.  After your evaluation with cardiology in the emergency department, you are advised to take half of your usual dose of hydrochlorothiazide.  You are to continue your regular potassium dose. 2.  You are going to be seen for follow-up in the cardiologist office this week. 3.  Return to the emergency department if you have any recurrence of symptoms or concerning symptoms develop.

## 2018-09-18 ENCOUNTER — Telehealth: Payer: Self-pay | Admitting: Cardiovascular Disease

## 2018-09-18 NOTE — Telephone Encounter (Signed)
Dr. Elmarie Shiley consult note from hospital states that we will check H/H and bmet at next visit

## 2018-09-18 NOTE — Telephone Encounter (Signed)
  Patient saw Dr Acie Fredrickson in the hospital on 09/17/18 and he stated he wanted her to have lab work done before her next visit. I scheduled her with Richardson Dopp 10/05/18 but need orders for lab work

## 2018-10-01 ENCOUNTER — Telehealth: Payer: Self-pay | Admitting: Cardiovascular Disease

## 2018-10-01 NOTE — Telephone Encounter (Signed)
New message       Pt stated that DR. Nasher called her to check on her B/P and stated that he would change appt so that she could see him instead of PA on or aound 2/7. Please follow up

## 2018-10-01 NOTE — Telephone Encounter (Signed)
Spoke with patient and moved her appointment to Monday 2/10 with Dr. Acie Fredrickson. The patient thanked me for my help.

## 2018-10-05 ENCOUNTER — Ambulatory Visit: Payer: BLUE CROSS/BLUE SHIELD | Admitting: Physician Assistant

## 2018-10-08 ENCOUNTER — Encounter: Payer: Self-pay | Admitting: Cardiovascular Disease

## 2018-10-08 ENCOUNTER — Ambulatory Visit: Payer: BLUE CROSS/BLUE SHIELD | Admitting: Cardiovascular Disease

## 2018-10-08 VITALS — BP 122/76 | HR 103 | Ht 66.0 in | Wt 142.1 lb

## 2018-10-08 DIAGNOSIS — I7300 Raynaud's syndrome without gangrene: Secondary | ICD-10-CM | POA: Diagnosis not present

## 2018-10-08 DIAGNOSIS — I10 Essential (primary) hypertension: Secondary | ICD-10-CM | POA: Diagnosis not present

## 2018-10-08 MED ORDER — DILTIAZEM HCL ER COATED BEADS 120 MG PO CP24: 120.0000 mg | ORAL_CAPSULE | Freq: Every day | ORAL | 11 refills | Status: DC

## 2018-10-08 NOTE — Progress Notes (Signed)
Katrina [name] Date of Birth  07-Mar-1961 [place] [address]    [place]   [place], [place]  [address]    [place], [place]  [address] [phone]  Fax  [phone]  [phone]  Fax [phone]  Problem List: 1. Hypetension 2. Mitral valve prolapse.  3. Hyperlipidemia   Previous Notes   Katrina [name] is a 58 yo who presents today for evaluation of HTN.  She was noted to have elevation of her diastolic blood pressure.  She's also been having some dyspnea with exertion. She went hiking at the [facility] [place] recently and had significant shortness breath climbing up the trail.  She feels like her metabolism has slowed recently.  She walks her dogs 2 miles a day without problems.   She has been working out and walking regularly.    November 09, 2012:  Katrina [name] was tried on Losartan - she was feeling poorly and her primary recommended that she stop.  She stopped the Losartan and is feeling better.   December 01, 2015:  Katrina [name] is seen back today after 2 year absence. Doing ok Had some pleurisy last month.  BP was a little elevated with that .  Has had some heart racing when traveling [place].   Walk every day.     Feb. 2, 2018: Doing well. No complaints  Not walking as much  Went to [place] this past summer   BP has been elevated.   Oct. 1, 2018:  Katrina [name] is seen today  Doing well. Walking regularly,  No CP or dyspnea  Has some allergies  LDL was 106 last week - takes Atorvastatin 10 mg a day   October 08, 2018:  Katrina [name] was seen in the emergency room on January 20 with an episode of presyncope.  It was thought to be most likely due to orthostatic hypotension.  Seem to be secondary to her HCTZ therapy.  We decreased the hydrochlorothiazide to 12.5 mg a day.  She is doing better at hydrating Had an occular migraine HR on Jan. 23. No further episodes of syncope or near syncope  Is on amlodipine - partly due to  Raynauds.  Will change it to Dilt. CD 120 today   Current Outpatient Medications on File Prior to Visit  Medication Sig Dispense Refill  . amLODipine (NORVASC) 2.5 MG tablet Take 1 tablet (2.5 mg total) by mouth daily. 90 tablet 3  . atorvastatin (LIPITOR) 20 MG tablet Take 1 tablet (20 mg total) by mouth daily. 90 tablet 3  . Cholecalciferol (VITAMIN D3) 5000 units TABS Take 1 tablet by mouth 2 (two) times daily.    . hydrochlorothiazide (HYDRODIURIL) 25 MG tablet Take 12.5 mg by mouth daily.    Marland Kitchen lisinopril (PRINIVIL,ZESTRIL) 10 MG tablet Take 1 tablet (10 mg total) by mouth daily. 90 tablet 3  . loratadine (CLARITIN) 10 MG tablet Take 10 mg by mouth daily.    . potassium chloride SA (K-DUR,KLOR-CON) 20 MEQ tablet Take 1 tablet (20 mEq total) by mouth daily. 90 tablet 2   No current facility-administered medications on file prior to visit.     Allergies  Allergen Reactions  . Cefuroxime Diarrhea and Nausea And Vomiting    Caused C Dif    Past Medical History:  Diagnosis Date  . Anal fissure   . C. difficile diarrhea    04/2013  . Constipation    chronic her entire life  .  Diverticulosis   . Family history of breast cancer in first degree relative   . Frozen shoulder    history of bilateral frozen shoulder  . GERD (gastroesophageal reflux disease)   . Headache(784.0)   . History of abnormal Pap smear    S/P lazer conization  . History of stress test    Stress echocardiogram 7/19: Normal clinical, ECG and echo response to stress.  Note very frequent monomorphic PVCs especially in early recovery.  . Hyperlipidemia   . Hypertension   . Lupus anticoagulant disorder ([place])    refuted by hematologist in 2014  . Menopause   . Mitral regurgitation    Echo 6/19:  EF 60-65, no RWMA, Gr 1 DD, mild post leaflet MVP, mod MR, normal RVSF, mild TR  . Mitral valve prolapse    a. echo 01/2009 showed EF 62-22%, systolic bowing of mitral valve without prolapse.   . Near syncope 09/17/2018   . Orthostatic hypotension 09/17/2018  . Osteopenia   . Raynaud disease    a. question false positive antiphospholipid panel-> sx later felt due to Raynaud itself (transient finger discoloration) rather than embolic phenomena.  . Vitamin D deficiency     Past Surgical History:  Procedure Laterality Date  . FOOT SURGERY  2007   left  . KNEE SURGERY  2006   left  . WISDOM TOOTH EXTRACTION  1981    Social History   Tobacco Use  Smoking Status Former Smoker  . Packs/day: 1.00  . Years: 6.00  . Pack years: 6.00  . Types: Cigarettes  . Last attempt to quit: 07/02/1991  . Years since quitting: 27.2  Smokeless Tobacco Never Used    Social History   Substance and Sexual Activity  Alcohol Use Yes   Comment: 1-2 glasses of wine rarely    Family History  Problem Relation Age of Onset  . Colon cancer Mother 33  . Breast cancer Mother   . Hypertension Mother   . Anemia Father   . Heart disease Father        CHF  . Lung disease Father   . Atrial fibrillation Father   . Breast cancer Maternal Aunt   . Cancer Paternal Aunt        brain  . Heart disease Maternal Grandmother   . Cancer Maternal Grandfather        throat  . Stroke Sister   . Heart attack Neg Hx     Reviw of Systems:  Reviewed in the HPI.  All other systems are negative.  Physical Exam: Blood pressure 122/76, pulse (!) 103, height 5\' 6"  (1.676 m), weight 142 lb 1.9 oz (64.5 kg), SpO2 96 %.  GEN:  Well nourished, well developed in no acute distress HEENT: Normal NECK: No JVD; No carotid bruits LYMPHATICS: No lymphadenopathy CARDIAC: RRR  , soft late systolic murmur radiating to the axilla/underneath her left breast. RESPIRATORY:  Clear to auscultation without rales, wheezing or rhonchi  ABDOMEN: Soft, non-tender, non-distended MUSCULOSKELETAL:  No edema; No deformity  SKIN: Warm and dry NEUROLOGIC:  Alert and oriented x 3   ECG:    Assessment / Plan:    1. Hypertension -blood pressure is  better.  She is not had any further episodes of syncope. She is somewhat tachycardic today.  We will discontinue the amlodipine and start her on diltiazem CD 120 mg a day.  2. Mitral valve prolapse.   Valve prolapse seems to be stable.  3. Hyperlipidemia Stable  4.  Anemic: Patient had a low hemoglobin when she was in the emergency room.  This is likely due to dilution from the IV normal saline that she received.  We will recheck a basic metabolic profile and CBC today.Mertie Moores, MD  10/08/2018 2:04 PM    [place],  [place], [place]  [address] Pager [phone] Phone: [phone]; Fax: [phone]

## 2018-10-08 NOTE — Patient Instructions (Signed)
Medication Instructions:  Your physician has recommended you make the following change in your medication:  STOP Amlodipine START Cardizem CD (Diltiazem) 120 mg once daily  If you need a refill on your cardiac medications before your next appointment, please call your pharmacy.   Lab work: TODAY - CBC, BMET  If you have labs (blood work) drawn today and your tests are completely normal, you will receive your results only by: Marland Kitchen MyChart Message (if you have MyChart) OR . A paper copy in the mail If you have any lab test that is abnormal or we need to change your treatment, we will call you to review the results.  Testing/Procedures: None Ordered   Follow-Up: At [facility], you and your health needs are our priority.  As part of our continuing mission to provide you with exceptional heart care, we have created designated Provider Care Teams.  These Care Teams include your primary Cardiologist (physician) and Advanced Practice Providers (APPs -  Physician Assistants and Nurse Practitioners) who all work together to provide you with the care you need, when you need it. You will need a follow up appointment in:  6 months.  Please call our office 2 months in advance to schedule this appointment.  You may see Mertie Moores, MD or one of the following Advanced Practice Providers on your designated Care Team: Richardson Dopp, PA-C [place], [place] . Daune Perch, NP

## 2018-10-09 LAB — CBC
Hematocrit: 38 % (ref 34.0–46.6)
Hemoglobin: 12.7 g/dL (ref 11.1–15.9)
MCH: 32.9 pg (ref 26.6–33.0)
MCHC: 33.4 g/dL (ref 31.5–35.7)
MCV: 98 fL — ABNORMAL HIGH (ref 79–97)
Platelets: 277 10*3/uL (ref 150–450)
RBC: 3.86 x10E6/uL (ref 3.77–5.28)
RDW: 11.6 % — ABNORMAL LOW (ref 11.7–15.4)
WBC: 6 10*3/uL (ref 3.4–10.8)

## 2018-10-09 LAB — BASIC METABOLIC PANEL
BUN/Creatinine Ratio: 23 (ref 9–23)
BUN: 17 mg/dL (ref 6–24)
CO2: 26 mmol/L (ref 20–29)
Calcium: 10 mg/dL (ref 8.7–10.2)
Chloride: 96 mmol/L (ref 96–106)
Creatinine, Ser: 0.74 mg/dL (ref 0.57–1.00)
GFR calc Af Amer: 103 mL/min/{1.73_m2} (ref >59)
GFR calc non Af Amer: 90 mL/min/{1.73_m2} (ref >59)
Glucose: 96 mg/dL (ref 65–99)
Potassium: 4.2 mmol/L (ref 3.5–5.2)
Sodium: 138 mmol/L (ref 134–144)

## 2018-10-24 ENCOUNTER — Other Ambulatory Visit: Payer: Self-pay

## 2018-10-24 MED ORDER — HYDROCHLOROTHIAZIDE 25 MG PO TABS: 12.5000 mg | ORAL_TABLET | Freq: Every day | ORAL | 0 refills | Status: DC

## 2018-12-25 ENCOUNTER — Other Ambulatory Visit: Payer: Self-pay | Admitting: Cardiovascular Disease

## 2019-02-04 ENCOUNTER — Other Ambulatory Visit: Payer: Self-pay | Admitting: Cardiovascular Disease

## 2019-02-19 ENCOUNTER — Other Ambulatory Visit: Payer: Self-pay | Admitting: Physician Assistant

## 2019-02-19 ENCOUNTER — Other Ambulatory Visit: Payer: Self-pay | Admitting: Cardiovascular Disease

## 2019-02-21 DIAGNOSIS — M1711 Unilateral primary osteoarthritis, right knee: Secondary | ICD-10-CM | POA: Diagnosis not present

## 2019-02-21 DIAGNOSIS — M25461 Effusion, right knee: Secondary | ICD-10-CM | POA: Diagnosis not present

## 2019-02-21 DIAGNOSIS — M25561 Pain in right knee: Secondary | ICD-10-CM | POA: Diagnosis not present

## 2019-03-06 DIAGNOSIS — M1711 Unilateral primary osteoarthritis, right knee: Secondary | ICD-10-CM | POA: Diagnosis not present

## 2019-03-25 ENCOUNTER — Encounter: Payer: Self-pay | Admitting: Gastroenterology

## 2019-03-27 ENCOUNTER — Other Ambulatory Visit: Payer: Self-pay | Admitting: Physician Assistant

## 2019-05-28 ENCOUNTER — Other Ambulatory Visit: Payer: Self-pay

## 2019-05-28 DIAGNOSIS — Z20822 Contact with and (suspected) exposure to covid-19: Secondary | ICD-10-CM

## 2019-05-29 LAB — NOVEL CORONAVIRUS, NAA: SARS-CoV-2, NAA: NOT DETECTED

## 2019-06-10 DIAGNOSIS — Z23 Encounter for immunization: Secondary | ICD-10-CM | POA: Diagnosis not present

## 2019-06-26 ENCOUNTER — Other Ambulatory Visit: Payer: Self-pay

## 2019-06-26 ENCOUNTER — Telehealth (HOSPITAL_COMMUNITY): Payer: Self-pay

## 2019-06-26 DIAGNOSIS — I340 Nonrheumatic mitral (valve) insufficiency: Secondary | ICD-10-CM

## 2019-06-26 NOTE — Telephone Encounter (Signed)
Echo order submitted for 11/4.

## 2019-06-26 NOTE — Telephone Encounter (Signed)
New message    The patient's echo order has expired - please resubmit a new order. Upcoming appt on hold for 11/4 @ 12:45pm

## 2019-07-03 ENCOUNTER — Ambulatory Visit (HOSPITAL_COMMUNITY): Payer: BC Managed Care – PPO | Attending: Cardiovascular Disease

## 2019-07-03 ENCOUNTER — Other Ambulatory Visit: Payer: Self-pay

## 2019-07-03 DIAGNOSIS — I340 Nonrheumatic mitral (valve) insufficiency: Secondary | ICD-10-CM

## 2019-07-04 ENCOUNTER — Encounter: Payer: Self-pay | Admitting: Family Medicine

## 2019-07-04 ENCOUNTER — Encounter: Payer: Self-pay | Admitting: Physician Assistant

## 2019-07-12 IMAGING — CR DG CHEST 2V
2 series · 2 of 2 positions shown · non-contrast
Comparison: 11/07/2007

CLINICAL DATA: Weakness with hypotension and nausea today.

EXAM:
CHEST - 2 VIEW

[chest pa]
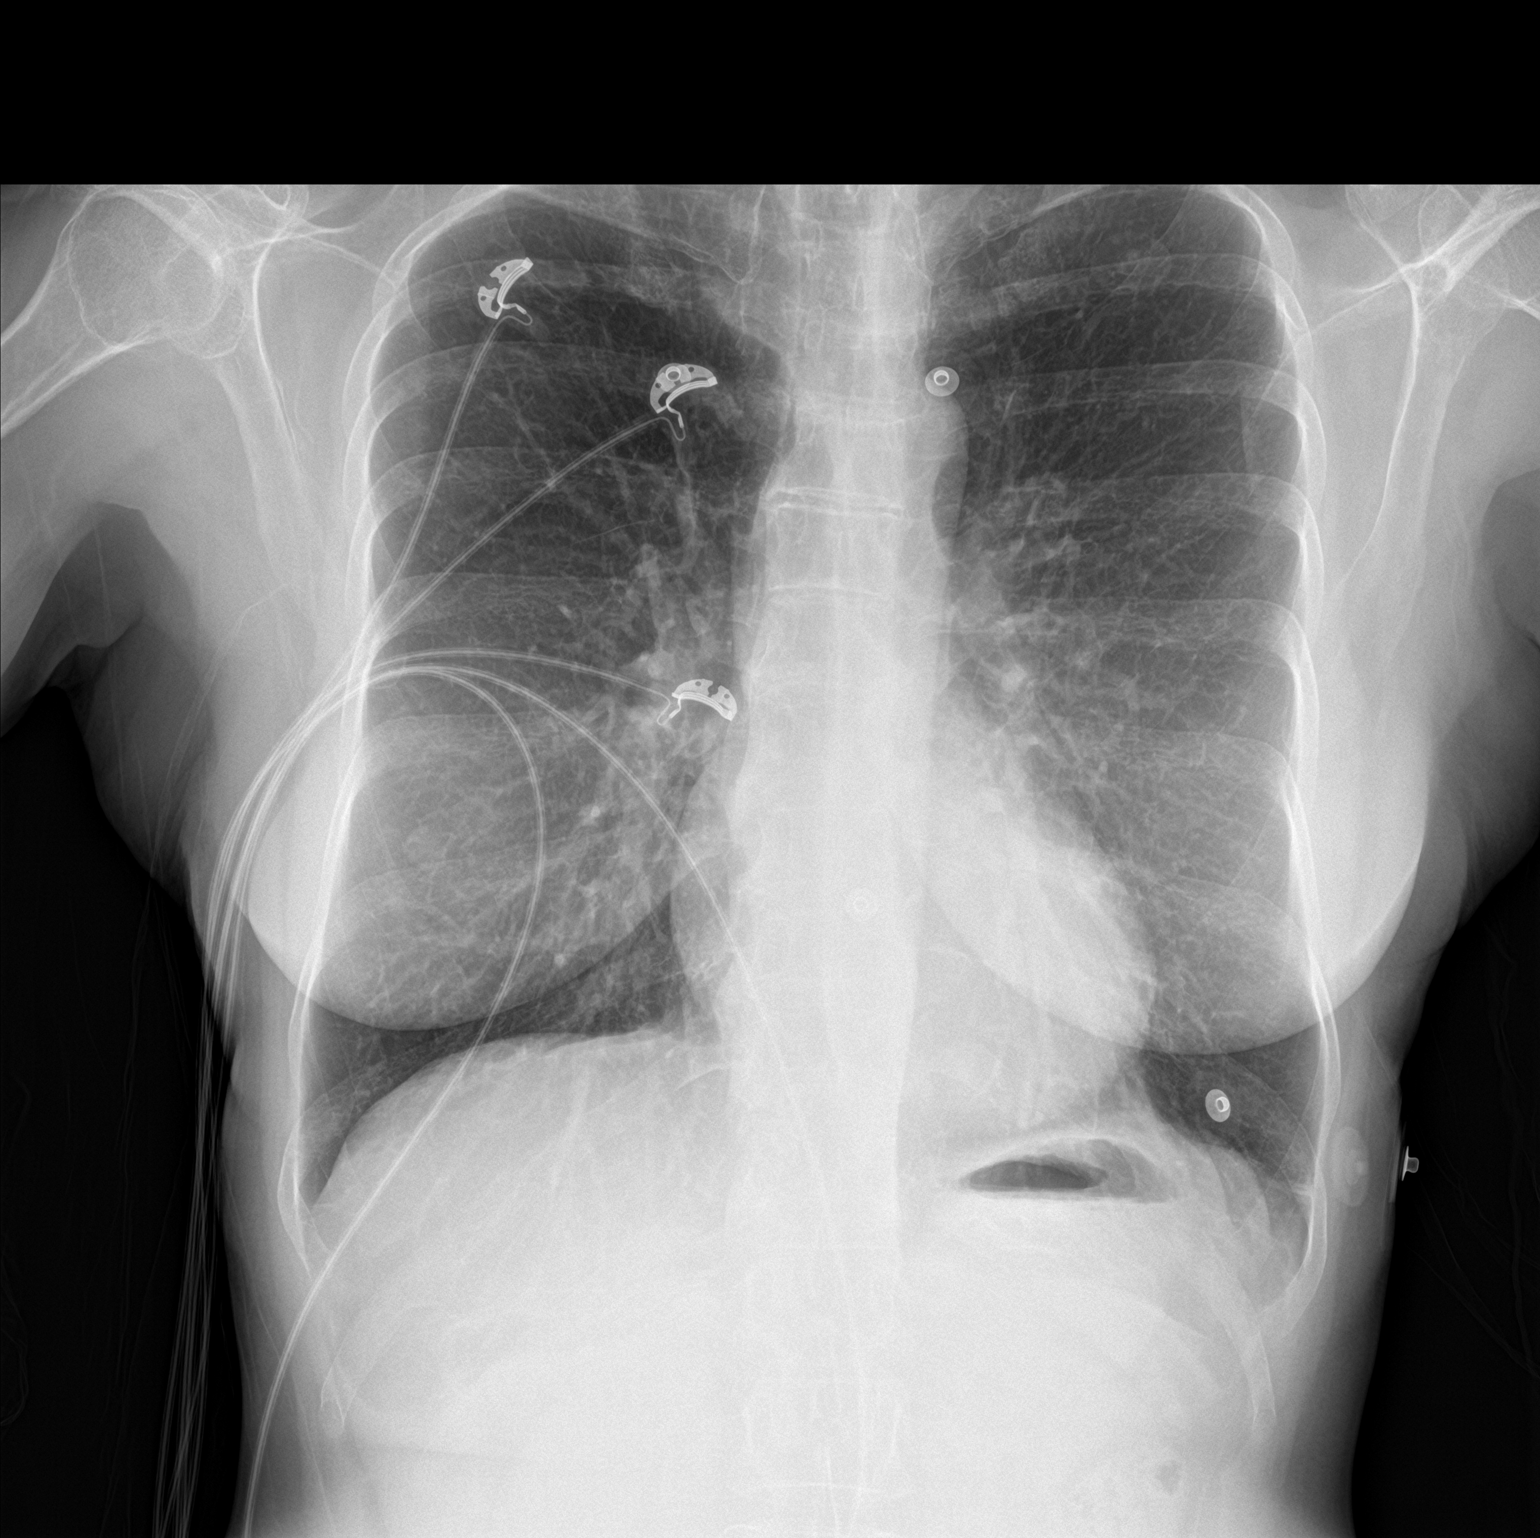

[chest lat]
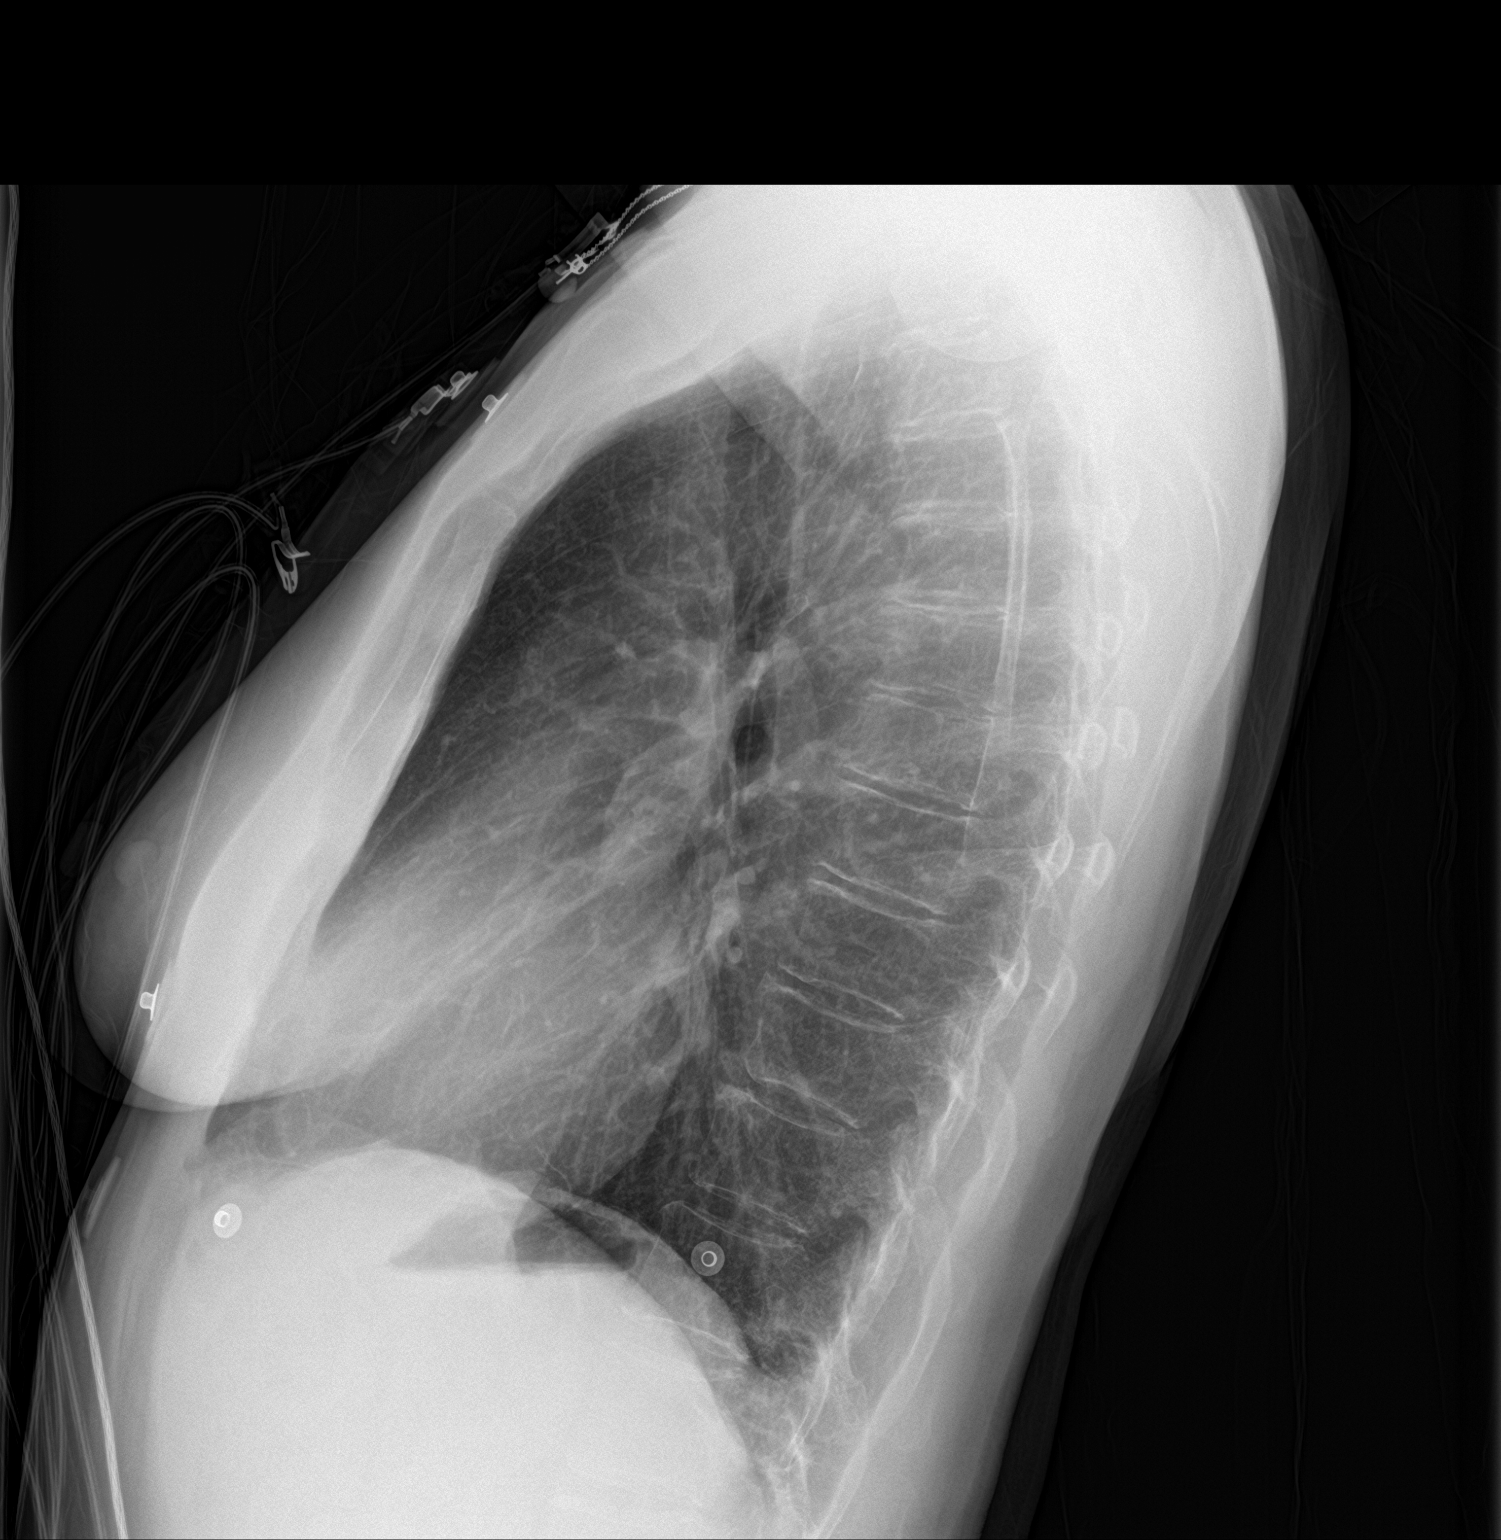

[2 of 2 positions shown; findings below may reference images not displayed]

FINDINGS: Lungs are adequately inflated and otherwise clear. Cardiomediastinal
silhouette and remainder of the exam is unchanged.
IMPRESSION: No active cardiopulmonary disease.

## 2019-07-15 NOTE — Progress Notes (Signed)
Called pt to set up TOC visit- pt is going to look at website first at the doctors taking patients and will call us back//tes

## 2019-08-31 ENCOUNTER — Other Ambulatory Visit: Payer: Self-pay | Admitting: Physician Assistant

## 2019-09-03 ENCOUNTER — Ambulatory Visit: Payer: BC Managed Care – PPO | Admitting: Cardiovascular Disease

## 2019-09-03 ENCOUNTER — Encounter: Payer: Self-pay | Admitting: Cardiovascular Disease

## 2019-09-03 ENCOUNTER — Other Ambulatory Visit: Payer: Self-pay

## 2019-09-03 VITALS — BP 122/80 | HR 77 | Ht 66.0 in | Wt 143.8 lb

## 2019-09-03 DIAGNOSIS — I7300 Raynaud's syndrome without gangrene: Secondary | ICD-10-CM

## 2019-09-03 DIAGNOSIS — I10 Essential (primary) hypertension: Secondary | ICD-10-CM

## 2019-09-03 DIAGNOSIS — I341 Nonrheumatic mitral (valve) prolapse: Secondary | ICD-10-CM | POA: Diagnosis not present

## 2019-09-03 DIAGNOSIS — E782 Mixed hyperlipidemia: Secondary | ICD-10-CM | POA: Diagnosis not present

## 2019-09-03 LAB — BASIC METABOLIC PANEL
BUN/Creatinine Ratio: 14 (ref 9–23)
BUN: 13 mg/dL (ref 6–24)
CO2: 24 mmol/L (ref 20–29)
Calcium: 9.9 mg/dL (ref 8.7–10.2)
Chloride: 95 mmol/L — ABNORMAL LOW (ref 96–106)
Creatinine, Ser: 0.91 mg/dL (ref 0.57–1.00)
GFR calc Af Amer: 80 mL/min/{1.73_m2} (ref >59)
GFR calc non Af Amer: 69 mL/min/{1.73_m2} (ref >59)
Glucose: 102 mg/dL — ABNORMAL HIGH (ref 65–99)
Potassium: 4.1 mmol/L (ref 3.5–5.2)
Sodium: 133 mmol/L — ABNORMAL LOW (ref 134–144)

## 2019-09-03 LAB — HEPATIC FUNCTION PANEL
ALT: 17 IU/L (ref 0–32)
AST: 18 IU/L (ref 0–40)
Albumin: 5 g/dL — ABNORMAL HIGH (ref 3.8–4.9)
Alkaline Phosphatase: 50 IU/L (ref 39–117)
Bilirubin Total: 0.5 mg/dL (ref 0.0–1.2)
Bilirubin, Direct: 0.14 mg/dL (ref 0.00–0.40)
Total Protein: 7.3 g/dL (ref 6.0–8.5)

## 2019-09-03 LAB — LIPID PANEL
Chol/HDL Ratio: 2.3 ratio (ref 0.0–4.4)
Cholesterol, Total: 201 mg/dL — ABNORMAL HIGH (ref 100–199)
HDL: 89 mg/dL (ref >39)
LDL Chol Calc (NIH): 100 mg/dL — ABNORMAL HIGH (ref 0–99)
Triglycerides: 69 mg/dL (ref 0–149)
VLDL Cholesterol Cal: 12 mg/dL (ref 5–40)

## 2019-09-03 NOTE — Progress Notes (Signed)
[name] Date of Birth  1961-05-19 [place] [name]    [place]   [place], [place]  [address]    [place], [place]  [address] [phone]  Fax  [phone]  [phone]  Fax [phone]  Problem List: 1. Hypetension 2. Mitral valve prolapse.  3. Hyperlipidemia   Previous Notes   Katrina [name] is a 59 yo who presents today for evaluation of HTN.  She was noted to have elevation of her diastolic blood pressure.  She's also been having some dyspnea with exertion. She went hiking at the [facility] of [place] recently and had significant shortness breath climbing up the trail.  She feels like her metabolism has slowed recently.  She walks her dogs 2 miles a day without problems.   She has been working out and walking regularly.    November 09, 2012:  Katrina [name] was tried on Losartan - she was feeling poorly and her primary recommended that she stop.  She stopped the Losartan and is feeling better.   December 01, 2015:  Katrina [name] is seen back today after 2 year absence. Doing ok Had some pleurisy last month.  BP was a little elevated with that .  Has had some heart racing when traveling [place].   Walk every day.     Feb. 2, 2018: Doing well. No complaints  Not walking as much  Went to [place] this past summer   BP has been elevated.   Oct. 1, 2018:  Katrina [name] is seen today  Doing well. Walking regularly,  No CP or dyspnea  Has some allergies  LDL was 106 last week - takes Atorvastatin 10 mg a day   October 08, 2018:  Katrina [name] was seen in the emergency room on January 20 with an episode of presyncope.  It was thought to be most likely due to orthostatic hypotension.  Seem to be secondary to her HCTZ therapy.  We decreased the hydrochlorothiazide to 12.5 mg a day.  She is doing better at hydrating Had an occular migraine HR on Jan. 23. No further episodes of syncope or near syncope  Is on amlodipine - partly due to  Raynauds.  Will change it to Dilt. CD 120 today   Jan. 5, 2021  Katrina [name] is seen today . Staying active.   Work is busy .  No CP or dyspnea.    Current Outpatient Medications on File Prior to Visit  Medication Sig Dispense Refill  . atorvastatin (LIPITOR) 20 MG tablet TAKE 1 TABLET BY MOUTH DAILY. 90 tablet 0  . Cholecalciferol (VITAMIN D3) 5000 units TABS Take 1 tablet by mouth 2 (two) times daily.    Marland Kitchen diltiazem (CARDIZEM CD) 120 MG 24 hr capsule Take 1 capsule (120 mg total) by mouth daily. 30 capsule 11  . hydrochlorothiazide (HYDRODIURIL) 25 MG tablet TAKE (1/2) TABLET DAILY. 45 tablet 2  . lisinopril (ZESTRIL) 10 MG tablet TAKE 1 TABLET ONCE DAILY. 90 tablet 1  . loratadine (CLARITIN) 10 MG tablet Take 10 mg by mouth daily.    . potassium chloride SA (K-DUR) 20 MEQ tablet TAKE 1 TABLET ONCE DAILY. 90 tablet 2   No current facility-administered medications on file prior to visit.    Allergies  Allergen Reactions  . Cefuroxime Diarrhea and Nausea And Vomiting    Caused C Dif    Past Medical History:  Diagnosis Date  . Anal fissure   . C. difficile  diarrhea    04/2013  . Constipation    chronic her entire life  . Diverticulosis   . Family history of breast cancer in first degree relative   . Frozen shoulder    history of bilateral frozen shoulder  . GERD (gastroesophageal reflux disease)   . Headache(784.0)   . History of abnormal Pap smear    S/P lazer conization  . History of stress test    Stress echocardiogram 7/19: Normal clinical, ECG and echo response to stress.  Note very frequent monomorphic PVCs especially in early recovery.  . Hyperlipidemia   . Hypertension   . Lupus anticoagulant disorder ([place])    refuted by hematologist in 2014  . Menopause   . Mitral regurgitation    Echo 6/19:  EF 60-65, no RWMA, Gr 1 DD, mild post leaflet MVP, mod MR, normal RVSF, mild TR // Echocardiogram 06/2019: EF 60-65, myxomatous mitral valve with mild bileaflet prolapse and mod  MR, mild AoV sclerosis (no AS), RVSP 30   . Mitral valve prolapse    a. echo 01/2009 showed EF 123456, systolic bowing of mitral valve without prolapse.   . Near syncope 09/17/2018  . Orthostatic hypotension 09/17/2018  . Osteopenia   . Raynaud disease    a. question false positive antiphospholipid panel-> sx later felt due to Raynaud itself (transient finger discoloration) rather than embolic phenomena.  . Vitamin D deficiency     Past Surgical History:  Procedure Laterality Date  . FOOT SURGERY  2007   left  . KNEE SURGERY  2006   left  . WISDOM TOOTH EXTRACTION  1981    Social History   Tobacco Use  Smoking Status Former Smoker  . Packs/day: 1.00  . Years: 6.00  . Pack years: 6.00  . Types: Cigarettes  . Quit date: 07/02/1991  . Years since quitting: 28.1  Smokeless Tobacco Never Used    Social History   Substance and Sexual Activity  Alcohol Use Yes   Comment: 1-2 glasses of wine rarely    Family History  Problem Relation Age of Onset  . Colon cancer Mother 28  . Breast cancer Mother   . Hypertension Mother   . Anemia Father   . Heart disease Father        CHF  . Lung disease Father   . Atrial fibrillation Father   . Breast cancer Maternal Aunt   . Cancer Paternal Aunt        brain  . Heart disease Maternal Grandmother   . Cancer Maternal Grandfather        throat  . Stroke Sister   . Heart attack Neg Hx     Reviw of Systems:  Reviewed in the HPI.  All other systems are negative.  Physical Exam: Blood pressure 122/80, pulse 77, height 5\' 6"  (1.676 m), weight 143 lb 12.8 oz (65.2 kg), SpO2 99 %.  GEN:  Well nourished, well developed in no acute distress HEENT: Normal NECK: No JVD; No carotid bruits LYMPHATICS: No lymphadenopathy CARDIAC: RRR ,  Soft late systolic murmur at mid axillary line  RESPIRATORY:  Clear to auscultation without rales, wheezing or rhonchi  ABDOMEN: Soft, non-tender, non-distended MUSCULOSKELETAL:  No edema; No deformity   SKIN: Warm and dry NEUROLOGIC:  Alert and oriented x 3   ECG:  Jan. 5, 2021 NSR at 77.  Occasional PVCS    Assessment / Plan:    1. Hypertension -  Well controlled.   Cont meds  check bmp today   2. Mitral valve prolapse.   Stable.   Has a late peaking systolic murmur, only heard in mid axiillary line.   No symptoms   3. Hyperlipidemia On atorva.  Check lipids today   4.  Raynaud's disease: He she is not having any current symptoms.  She is careful about keeping her hands warm.  Continue diltiazem.   Mertie Moores, MD  09/03/2019 8:35 AM    [place],  [place], [place]  [address] Pager [phone] Phone: [phone]; Fax: [phone]

## 2019-09-03 NOTE — Patient Instructions (Signed)
Medication Instructions:  Your provider recommends that you continue on your current medications as directed. Please refer to the Current Medication list given to you today.   *If you need a refill on your cardiac medications before your next appointment, please call your pharmacy*  Lab Work: TODAYL lipids, liver, kidneys, electrolytes If you have labs (blood work) drawn today and your tests are completely normal, you will receive your results only by: Marland Kitchen MyChart Message (if you have MyChart) OR . A paper copy in the mail If you have any lab test that is abnormal or we need to change your treatment, we will call you to review the results.  Follow-Up: At [facility], you and your health needs are our priority.  As part of our continuing mission to provide you with exceptional heart care, we have created designated Provider Care Teams.  These Care Teams include your primary Cardiologist (physician) and Advanced Practice Providers (APPs -  Physician Assistants and Nurse Practitioners) who all work together to provide you with the care you need, when you need it. Your next appointment:   12 month(s) The format for your next appointment:   In Person Provider:   You may see Mertie Moores, MD or one of the following Advanced Practice Providers on your designated Care Team:    Richardson Dopp, PA-C  [place], [place]  Daune Perch, [place]

## 2019-09-19 ENCOUNTER — Other Ambulatory Visit: Payer: Self-pay

## 2019-09-19 ENCOUNTER — Ambulatory Visit: Payer: BC Managed Care – PPO | Admitting: Family Medicine

## 2019-09-19 ENCOUNTER — Encounter: Payer: Self-pay | Admitting: Family Medicine

## 2019-09-19 VITALS — BP 118/70 | HR 80 | Temp 97.9°F | Wt 145.0 lb

## 2019-09-19 DIAGNOSIS — Z8639 Personal history of other endocrine, nutritional and metabolic disease: Secondary | ICD-10-CM | POA: Diagnosis not present

## 2019-09-19 DIAGNOSIS — E785 Hyperlipidemia, unspecified: Secondary | ICD-10-CM | POA: Diagnosis not present

## 2019-09-19 DIAGNOSIS — R635 Abnormal weight gain: Secondary | ICD-10-CM

## 2019-09-19 DIAGNOSIS — R600 Localized edema: Secondary | ICD-10-CM

## 2019-09-19 DIAGNOSIS — H6123 Impacted cerumen, bilateral: Secondary | ICD-10-CM | POA: Diagnosis not present

## 2019-09-19 DIAGNOSIS — I10 Essential (primary) hypertension: Secondary | ICD-10-CM | POA: Diagnosis not present

## 2019-09-19 NOTE — Progress Notes (Addendum)
Subjective:    Patient ID: Katrina [name], female    DOB: 04/02/1961, 59 y.o.   MRN: [id number]  No chief complaint on file.   HPI Pt is a 59 yo female with pmh sig for Raynaud's dz, MVP, migraines, HTN, GERD, osteopenia, Vit D def, HLD, seen today for f/u on chronic conditions and TOC, previously seen by Dr. Maudie Mercury.   Also followed by Ortho (h/o b/l frozen shoulder and b/l knee arthroscopy), OB/Gyn, and Cardiology.  HLD: -followed by Cardiology, Dr. Cathie Olden -taking lipitor 20 mg daily  HTN: -taking HCTZ 12.5 mg, lisinopril 10 mg, Cardizem CD 120 mg.  Also on Kdur 20 mEq -denies HAs, CP, changes in vision  Joint issues: -pt notes ongoing hand stiffness -notes edema in hands if goes running. -missed dx'd with a clotting d/o yrs ago. -states had xrays of hands and negative work up for RA in the past. -notes a h/o RA in a relative (grandparent?) -also had LE edema at the end of the day -started on HCTZ which helps with edema in LEs.  No longer present.  Weight gain: -pt notes wt gain over the last yr or so. -eating healthy and exercising -pt is post menopausal.  Ear concern: -R ear feels "clogged"  H/o Vit D def: -occasionally taking a vit D supplement -denies current symptoms -exercises by walking her dogs daily.  Also tries to get up and move more when working.  Social hx: Pt is married.  She has children, one is immunocompromised.  Pt works as an Arboriculturist.    Past Medical History:  Diagnosis Date  . Anal fissure   . C. difficile diarrhea    04/2013  . Constipation    chronic her entire life  . Diverticulosis   . Family history of breast cancer in first degree relative   . Frozen shoulder    history of bilateral frozen shoulder  . GERD (gastroesophageal reflux disease)   . Headache(784.0)   . History of abnormal Pap smear    S/P lazer conization  . History of stress test    Stress echocardiogram 7/19: Normal clinical, ECG and echo response to stress.   Note very frequent monomorphic PVCs especially in early recovery.  . Hyperlipidemia   . Hypertension   . Lupus anticoagulant disorder ([place])    refuted by hematologist in 2014  . Menopause   . Mitral regurgitation    Echo 6/19:  EF 60-65, no RWMA, Gr 1 DD, mild post leaflet MVP, mod MR, normal RVSF, mild TR // Echocardiogram 06/2019: EF 60-65, myxomatous mitral valve with mild bileaflet prolapse and mod MR, mild AoV sclerosis (no AS), RVSP 30   . Mitral valve prolapse    a. echo 01/2009 showed EF 123456, systolic bowing of mitral valve without prolapse.   . Near syncope 09/17/2018  . Orthostatic hypotension 09/17/2018  . Osteopenia   . Raynaud disease    a. question false positive antiphospholipid panel-> sx later felt due to Raynaud itself (transient finger discoloration) rather than embolic phenomena.  . Vitamin D deficiency     Allergies  Allergen Reactions  . Cefuroxime Diarrhea and Nausea And Vomiting    Caused C Dif    ROS General: Denies fever, chills, night sweats, changes in weight, changes in appetite  +weight gain HEENT: Denies headaches, ear pain, changes in vision, rhinorrhea, sore throat  +R ear "clogged" CV: Denies CP, palpitations, SOB, orthopnea Pulm: Denies SOB, cough, wheezing GI: Denies abdominal pain, nausea, vomiting, diarrhea, constipation  GU: Denies dysuria, hematuria, frequency, vaginal discharge Msk: Denies muscle cramps, joint pains  +hand edema/joint tightness. Neuro: Denies weakness, numbness, tingling Skin: Denies rashes, bruising Psych: Denies depression, anxiety, hallucinations     Objective:    Blood pressure 118/70, pulse 80, temperature 97.9 F (36.6 C), temperature source Temporal, weight 145 lb (65.8 kg), SpO2 97 %.  Gen. Pleasant, well-nourished, in no distress, normal affect   HEENT: [place]/AT, face symmetric,  no scleral icterus, PERRLA, EOMI, nares patent without drainage, pharynx without erythema or exudate.  B/l canals occluded with  cerumen.  Hard cerumen remains in b/l canals s/p irrigation. Neck: No JVD, no thyromegaly, no carotid bruits Lungs: no accessory muscle use, CTAB, no wheezes or rales Cardiovascular: RRR, no m/r/g, no peripheral edema Musculoskeletal: Mildly enlarged PIP joints b/l hands, no cyanosis or clubbing, normal tone Neuro:  A&Ox3, CN II-XII intact, normal gait Skin:  Warm, no lesions/ rash   Wt Readings from Last 3 Encounters:  09/03/19 143 lb 12.8 oz (65.2 kg)  10/08/18 142 lb 1.9 oz (64.5 kg)  03/27/18 140 lb 1.9 oz (63.6 kg)    Lab Results  Component Value Date   WBC 6.0 10/08/2018   HGB 12.7 10/08/2018   HCT 38.0 10/08/2018   PLT 277 10/08/2018   GLUCOSE 102 (H) 09/03/2019   CHOL 201 (H) 09/03/2019   TRIG 69 09/03/2019   HDL 89 09/03/2019   LDLDIRECT 144.4 12/15/2010   LDLCALC 100 (H) 09/03/2019   ALT 17 09/03/2019   AST 18 09/03/2019   NA 133 (L) 09/03/2019   K 4.1 09/03/2019   CL 95 (L) 09/03/2019   CREATININE 0.91 09/03/2019   BUN 13 09/03/2019   CO2 24 09/03/2019   TSH 1.520 02/14/2018   INR 1.04 02/09/2016   HGBA1C 5.5 10/14/2016    Assessment/Plan:  Essential hypertension -Controlled -Continue current medications including Cardizem 120 mg, HCTZ 12.5 mg, lisinopril 10 mg and K-Dur 20 mEq daily. -Continue lifestyle modifications  Hyperlipidemia, unspecified hyperlipidemia type -Controlled -Last lipid panel 09/03/2019: Total cholesterol 201, HDL 89, triglycerides 69, LDL 100 -Continue Lipitor 20 mg daily -Continue lifestyle modifications  History of vitamin D deficiency -Continue daily vitamin D supplement -Discussed rechecking vitamin D level at next visit  Hand edema -Stable -Discussed reevaluation for possible arthritis for continued symptoms -Continue to monitor  Bilateral impacted cerumen -Consent obtained.  Bilateral ears irrigated.  Pt tolerated procedure well, however unable to remove cerumen. -given time constraints, pt advised to obtain Debrox   -Given handout  Weight gain -discussed possible causes menopause, snacking during COVID, etc -encouraged to continue increasing physical activity and drink plenty of water. -consider decreasing portions -given handout  F/u prn  Grier Mitts, MD

## 2019-09-19 NOTE — Patient Instructions (Addendum)
Earwax Buildup, Adult The ears produce a substance called earwax that helps keep bacteria out of the ear and protects the skin in the ear canal. Occasionally, earwax can build up in the ear and cause discomfort or hearing loss. What increases the risk? This condition is more likely to develop in people who:  Are female.  Are elderly.  Naturally produce more earwax.  Clean their ears often with cotton swabs.  Use earplugs often.  Use in-ear headphones often.  Wear hearing aids.  Have narrow ear canals.  Have earwax that is overly thick or sticky.  Have eczema.  Are dehydrated.  Have excess hair in the ear canal. What are the signs or symptoms? Symptoms of this condition include:  Reduced or muffled hearing.  A feeling of fullness in the ear or feeling that the ear is plugged.  Fluid coming from the ear.  Ear pain.  Ear itch.  Ringing in the ear.  Coughing.  An obvious piece of earwax that can be seen inside the ear canal. How is this diagnosed? This condition may be diagnosed based on:  Your symptoms.  Your medical history.  An ear exam. During the exam, your health care provider will look into your ear with an instrument called an otoscope. You may have tests, including a hearing test. How is this treated? This condition may be treated by:  Using ear drops to soften the earwax.  Having the earwax removed by a health care provider. The health care provider may: ? Flush the ear with water. ? Use an instrument that has a loop on the end (curette). ? Use a suction device.  Surgery to remove the wax buildup. This may be done in severe cases. Follow these instructions at home:   Take over-the-counter and prescription medicines only as told by your health care provider.  Do not put any objects, including cotton swabs, into your ear. You can clean the opening of your ear canal with a washcloth or facial tissue.  Follow instructions from your health care  provider about cleaning your ears. Do not over-clean your ears.  Drink enough fluid to keep your urine clear or pale yellow. This will help to thin the earwax.  Keep all follow-up visits as told by your health care provider. If earwax builds up in your ears often or if you use hearing aids, consider seeing your health care provider for routine, preventive ear cleanings. Ask your health care provider how often you should schedule your cleanings.  If you have hearing aids, clean them according to instructions from the manufacturer and your health care provider. Contact a health care provider if:  You have ear pain.  You develop a fever.  You have blood, pus, or other fluid coming from your ear.  You have hearing loss.  You have ringing in your ears that does not go away.  Your symptoms do not improve with treatment.  You feel like the room is spinning (vertigo). Summary  Earwax can build up in the ear and cause discomfort or hearing loss.  The most common symptoms of this condition include reduced or muffled hearing and a feeling of fullness in the ear or feeling that the ear is plugged.  This condition may be diagnosed based on your symptoms, your medical history, and an ear exam.  This condition may be treated by using ear drops to soften the earwax or by having the earwax removed by a health care provider.  Do not put any   objects, including cotton swabs, into your ear. You can clean the opening of your ear canal with a washcloth or facial tissue. This information is not intended to replace advice given to you by your health care provider. Make sure you discuss any questions you have with your health care provider. Document Revised: 07/28/2017 Document Reviewed: 10/26/2016 Elsevier Patient Education  2020 [employer].  Managing Your Hypertension Hypertension is commonly called high blood pressure. This is when the force of your blood pressing against the walls of your arteries  is too strong. Arteries are blood vessels that carry blood from your heart throughout your body. Hypertension forces the heart to work harder to pump blood, and may cause the arteries to become narrow or stiff. Having untreated or uncontrolled hypertension can cause heart attack, stroke, kidney disease, and other problems. What are blood pressure readings? A blood pressure reading consists of a higher number over a lower number. Ideally, your blood pressure should be below 120/80. The first ("top") number is called the systolic pressure. It is a measure of the pressure in your arteries as your heart beats. The second ("bottom") number is called the diastolic pressure. It is a measure of the pressure in your arteries as the heart relaxes. What does my blood pressure reading mean? Blood pressure is classified into four stages. Based on your blood pressure reading, your health care provider may use the following stages to determine what type of treatment you need, if any. Systolic pressure and diastolic pressure are measured in a unit called mm Hg. Normal  Systolic pressure: below 123456.  Diastolic pressure: below 80. Elevated  Systolic pressure: [id number].  Diastolic pressure: below 80. Hypertension stage 1  Systolic pressure: 0000000.  Diastolic pressure: [id number]. Hypertension stage 2  Systolic pressure: [id number] or above.  Diastolic pressure: 90 or above. What health risks are associated with hypertension? Managing your hypertension is an important responsibility. Uncontrolled hypertension can lead to:  A heart attack.  A stroke.  A weakened blood vessel (aneurysm).  Heart failure.  Kidney damage.  Eye damage.  Metabolic syndrome.  Memory and concentration problems. What changes can I make to manage my hypertension? Hypertension can be managed by making lifestyle changes and possibly by taking medicines. Your health care provider will help you make a plan to bring your blood pressure  within a normal range. Eating and drinking   Eat a diet that is high in fiber and potassium, and low in salt (sodium), added sugar, and fat. An example eating plan is called the DASH (Dietary Approaches to Stop Hypertension) diet. To eat this way: ? Eat plenty of fresh fruits and vegetables. Try to fill half of your plate at each meal with fruits and vegetables. ? Eat whole grains, such as whole wheat pasta, brown rice, or whole grain bread. Fill about one quarter of your plate with whole grains. ? Eat low-fat diary products. ? Avoid fatty cuts of meat, processed or cured meats, and poultry with skin. Fill about one quarter of your plate with lean proteins such as fish, chicken without skin, beans, eggs, and tofu. ? Avoid premade and processed foods. These tend to be higher in sodium, added sugar, and fat.  Reduce your daily sodium intake. Most people with hypertension should eat less than 1,500 mg of sodium a day.  Limit alcohol intake to no more than 1 drink a day for nonpregnant women and 2 drinks a day for men. One drink equals 12 oz of beer, 5 oz  of wine, or 1 oz of hard liquor. Lifestyle  Work with your health care provider to maintain a healthy body weight, or to lose weight. Ask what an ideal weight is for you.  Get at least 30 minutes of exercise that causes your heart to beat faster (aerobic exercise) most days of the week. Activities may include walking, swimming, or biking.  Include exercise to strengthen your muscles (resistance exercise), such as weight lifting, as part of your weekly exercise routine. Try to do these types of exercises for 30 minutes at least 3 days a week.  Do not use any products that contain nicotine or tobacco, such as cigarettes and e-cigarettes. If you need help quitting, ask your health care provider.  Control any long-term (chronic) conditions you have, such as high cholesterol or diabetes. Monitoring  Monitor your blood pressure at home as told by  your health care provider. Your personal target blood pressure may vary depending on your medical conditions, your age, and other factors.  Have your blood pressure checked regularly, as often as told by your health care provider. Working with your health care provider  Review all the medicines you take with your health care provider because there may be side effects or interactions.  Talk with your health care provider about your diet, exercise habits, and other lifestyle factors that may be contributing to hypertension.  Visit your health care provider regularly. Your health care provider can help you create and adjust your plan for managing hypertension. Will I need medicine to control my blood pressure? Your health care provider may prescribe medicine if lifestyle changes are not enough to get your blood pressure under control, and if:  Your systolic blood pressure is 130 or higher.  Your diastolic blood pressure is 80 or higher. Take medicines only as told by your health care provider. Follow the directions carefully. Blood pressure medicines must be taken as prescribed. The medicine does not work as well when you skip doses. Skipping doses also puts you at risk for problems. Contact a health care provider if:  You think you are having a reaction to medicines you have taken.  You have repeated (recurrent) headaches.  You feel dizzy.  You have swelling in your ankles.  You have trouble with your vision. Get help right away if:  You develop a severe headache or confusion.  You have unusual weakness or numbness, or you feel faint.  You have severe pain in your chest or abdomen.  You vomit repeatedly.  You have trouble breathing. Summary  Hypertension is when the force of blood pumping through your arteries is too strong. If this condition is not controlled, it may put you at risk for serious complications.  Your personal target blood pressure may vary depending on your  medical conditions, your age, and other factors. For most people, a normal blood pressure is less than 120/80.  Hypertension is managed by lifestyle changes, medicines, or both. Lifestyle changes include weight loss, eating a healthy, low-sodium diet, exercising more, and limiting alcohol. This information is not intended to replace advice given to you by your health care provider. Make sure you discuss any questions you have with your health care provider. Document Revised: 12/07/2018 Document Reviewed: 07/13/2016 Elsevier Patient Education  [place].  Dyslipidemia Dyslipidemia is an imbalance of waxy, fat-like substances (lipids) in the blood. The body needs lipids in small amounts. Dyslipidemia often involves a high level of cholesterol or triglycerides, which are types of lipids. Common  forms of dyslipidemia include:  High levels of LDL cholesterol. LDL is the type of cholesterol that causes fatty deposits (plaques) to build up in the blood vessels that carry blood away from your heart (arteries).  Low levels of HDL cholesterol. HDL cholesterol is the type of cholesterol that protects against heart disease. High levels of HDL remove the LDL buildup from arteries.  High levels of triglycerides. Triglycerides are a fatty substance in the blood that is linked to a buildup of plaques in the arteries. What are the causes? Primary dyslipidemia is caused by changes (mutations) in genes that are passed down through families (inherited). These mutations cause several types of dyslipidemia. Secondary dyslipidemia is caused by lifestyle choices and diseases that lead to dyslipidemia, such as:  Eating a diet that is high in animal fat.  Not getting enough exercise.  Having diabetes, kidney disease, liver disease, or thyroid disease.  Drinking large amounts of alcohol.  Using certain medicines. What increases the risk? You are more likely to develop this condition if you are an older  man or if you are a woman who has gone through menopause. Other risk factors include:  Having a family history of dyslipidemia.  Taking certain medicines, including birth control pills, steroids, some diuretics, and beta-blockers.  Smoking cigarettes.  Eating a high-fat diet.  Having certain medical conditions such as diabetes, polycystic ovary syndrome (PCOS), kidney disease, liver disease, or hypothyroidism.  Not exercising regularly.  Being overweight or obese with too much belly fat. What are the signs or symptoms? In most cases, dyslipidemia does not usually cause any symptoms. In severe cases, very high lipid levels can cause:  Fatty bumps under the skin (xanthomas).  White or gray ring around the black center (pupil) of the eye. Very high triglyceride levels can cause inflammation of the pancreas (pancreatitis). How is this diagnosed? Your health care provider may diagnose dyslipidemia based on a routine blood test (fasting blood test). Because most people do not have symptoms of the condition, this blood testing (lipid profile) is done on adults age 82 and older and is repeated every 5 years. This test checks:  Total cholesterol. This measures the total amount of cholesterol in your blood, including LDL cholesterol, HDL cholesterol, and triglycerides. A healthy number is below 200.  LDL cholesterol. The target number for LDL cholesterol is different for each person, depending on individual risk factors. Ask your health care provider what your LDL cholesterol should be.  HDL cholesterol. An HDL level of 60 or higher is best because it helps to protect against heart disease. A number below 58 for men or below 19 for women increases the risk for heart disease.  Triglycerides. A healthy triglyceride number is below 150. If your lipid profile is abnormal, your health care provider may do other blood tests. How is this treated? Treatment depends on the type of dyslipidemia that  you have and your other risk factors for heart disease and stroke. Your health care provider will have a target range for your lipid levels based on this information. For many people, this condition may be treated by lifestyle changes, such as diet and exercise. Your health care provider may recommend that you:  Get regular exercise.  Make changes to your diet.  Quit smoking if you smoke. If diet changes and exercise do not help you reach your goals, your health care provider may also prescribe medicine to lower lipids. The most commonly prescribed type of medicine lowers your LDL cholesterol (  statin drug). If you have a high triglyceride level, your provider may prescribe another type of drug (fibrate) or an omega-3 fish oil supplement, or both. Follow these instructions at home:  Eating and drinking  Follow instructions from your health care provider or dietitian about eating or drinking restrictions.  Eat a healthy diet as told by your health care provider. This can help you reach and maintain a healthy weight, lower your LDL cholesterol, and raise your HDL cholesterol. This may include: ? Limiting your calories, if you are overweight. ? Eating more fruits, vegetables, whole grains, fish, and lean meats. ? Limiting saturated fat, trans fat, and cholesterol.  If you drink alcohol: ? Limit how much you use. ? Be aware of how much alcohol is in your drink. In the [place] one drink equals one 12 oz bottle of beer (355 mL), one 5 oz glass of wine (148 mL), or one 1 oz glass of hard liquor (44 mL).  Do not drink alcohol if: ? Your health care provider tells you not to drink. ? You are pregnant, may be pregnant, or are planning to become pregnant. Activity  Get regular exercise. Start an exercise and strength training program as told by your health care provider. Ask your health care provider what activities are safe for you. Your health care provider may recommend: ? 30 minutes of aerobic  activity 4-6 days a week. Brisk walking is an example of aerobic activity. ? Strength training 2 days a week. General instructions  Do not use any products that contain nicotine or tobacco, such as cigarettes, e-cigarettes, and chewing tobacco. If you need help quitting, ask your health care provider.  Take over-the-counter and prescription medicines only as told by your health care provider. This includes supplements.  Keep all follow-up visits as told by your health care provider. Contact a health care provider if:  You are: ? Having trouble sticking to your exercise or diet plan. ? Struggling to quit smoking or control your use of alcohol. Summary  Dyslipidemia often involves a high level of cholesterol or triglycerides, which are types of lipids.  Treatment depends on the type of dyslipidemia that you have and your other risk factors for heart disease and stroke.  For many people, treatment starts with lifestyle changes, such as diet and exercise.  Your health care provider may prescribe medicine to lower lipids. This information is not intended to replace advice given to you by your health care provider. Make sure you discuss any questions you have with your health care provider. Document Revised: 04/09/2018 Document Reviewed: 03/16/2018 Elsevier Patient Education  [place].  Preventing Vitamin D Deficiency Vitamin D is a nutrient that helps your body absorb calcium from food. It plays a key role in the health of bones and teeth, muscle function, and infection prevention. Our bodies make vitamin D when our skin is exposed to direct sunlight. However, for many people, this may not be enough vitamin D to meet the body's needs. When you get too little vitamin D, it is called a deficiency. How can this condition affect me? A vitamin D deficiency can put you at risk of developing conditions that cause bones to be brittle, such as rickets or osteoporosis. If you are over age  6, not having enough vitamin D may weaken your muscles and bones and increase your risk for falls and broken bones. What can increase my risk? You may be at risk for a vitamin D deficiency if you:  Are pregnant.  Are obese.  Are over 109 years old.  Have dark skin.  Take certain medicines that affect the way vitamin D is absorbed.  Have had gastric bypass surgery. Other risk factors include:  Having a condition that limits your ability to absorb fat, such as cystic fibrosis, celiac disease, or inflammatory bowel disease.  Having certain inherited conditions.  Not having access to foods rich in vitamin D.  Having limited ability to move.  Living in areas that have fewer hours of sunlight.  Spending most of your day indoors, or you cover your skin all the time when you are outdoors. Breastfed infants are also at risk for vitamin D deficiency. What actions can I take to reduce my risk of a vitamin D deficiency? Knowing the best sources of vitamin D You can meet your daily vitamin D needs from:  Foods.  Dietary supplements.  Direct exposure to natural sunlight.  Infant formula (for babies). Knowing how much vitamin D you need General recommendations for daily vitamin D intake vary by these categories:  Infants: 400 International Units.  Children over 73 year old: 600 International Units.  Adults: 600 International Units.  Pregnant and breastfeeding women: 600 International Units.  Adults over 9 years old: 58 International Units. These are minimum levels of recommended amounts. Your health care provider may recommend a different amount of vitamin D intake based on your specific needs and your overall health. Getting sun exposure  Get regular, safe exposure to natural sunlight. Expose your skin to direct sunlight for at least 15 minutes every day. If you have dark skin, you may need to expose your skin for a longer period of time.  Protect your skin from too much  sun exposure. This helps to prevent skin cancer.  Ask your health care provider if regular sun exposure is safe for you.  Do not use a tanning bed. Eating and drinking   Eat foods that naturally contain vitamin D. These include: ? Beef liver. ? Egg yolk. ? Fatty fish, such as cod, salmon, trout, swordfish, shrimp, sardines, and tuna. ? Cheese. ? Mushrooms. ? Oysters.  Eat or drink products that have been fortified with vitamin D. Fortified means that vitamin D has been added to the food. These may include: ? Cereals. ? Dairy products, such as milk, yogurt, butter, or margarine. ? Orange juice. ? Alternative milks, such as soy milk or almond milk.  When choosing foods, check the food label on the package to see: ? How much vitamin D is in the item. ? If the food is fortified with vitamin D.  Although it is hard to get your vitamin D requirement from foods alone, you should eat a balanced diet each day that includes foods naturally higher in vitamin D or fortified with it. Try to include the following in your diet each day: ? 2-3 servings of meat or meat alternatives. ? 2-3 servings of dairy. Taking supplements If you are at risk for vitamin D deficiency, or if you have certain diseases, your health care provider may recommend that you take a vitamin D supplement. Make sure you:  Talk with your health care provider before you start taking any vitamin D supplements. You may be more sensitive to the side effects of vitamin D supplements if you are on certain medicines or have certain medical conditions.  Tell your health care provider about all medicines you are taking, including vitamin, mineral, and herbal supplements.  Take medicines and supplements only  as told by your health care provider. Summary  Vitamin D is a nutrient that helps your body absorb calcium from food.  A vitamin D deficiency can put you at risk of developing conditions that cause bones to be brittle, such as  rickets or osteoporosis.  Our bodies make vitamin D when our skin is exposed to direct sunlight. However, for many people, this may not be enough vitamin D to meet the body's needs.  Some foods naturally contain vitamin D, including beef liver, egg yolk, and fatty fish.  Products may also be fortified with vitamin D. Fortified means that vitamin D has been added to the food. This information is not intended to replace advice given to you by your health care provider. Make sure you discuss any questions you have with your health care provider. Document Revised: 05/08/2019 Document Reviewed: 08/10/2018 Elsevier Patient Education  2020 [employer].  Exercising to Lose Weight Exercise is structured, repetitive physical activity to improve fitness and health. Getting regular exercise is important for everyone. It is especially important if you are overweight. Being overweight increases your risk of heart disease, stroke, diabetes, high blood pressure, and several types of cancer. Reducing your calorie intake and exercising can help you lose weight. Exercise is usually categorized as moderate or vigorous intensity. To lose weight, most people need to do a certain amount of moderate-intensity or vigorous-intensity exercise each week. Moderate-intensity exercise  Moderate-intensity exercise is any activity that gets you moving enough to burn at least three times more energy (calories) than if you were sitting. Examples of moderate exercise include:  Walking a mile in 15 minutes.  Doing light yard work.  Biking at an easy pace. Most people should get at least 150 minutes (2 hours and 30 minutes) a week of moderate-intensity exercise to maintain their body weight. Vigorous-intensity exercise Vigorous-intensity exercise is any activity that gets you moving enough to burn at least six times more calories than if you were sitting. When you exercise at this intensity, you should be working hard enough  that you are not able to carry on a conversation. Examples of vigorous exercise include:  Running.  Playing a team sport, such as football, basketball, and soccer.  Jumping rope. Most people should get at least 75 minutes (1 hour and 15 minutes) a week of vigorous-intensity exercise to maintain their body weight. How can exercise affect me? When you exercise enough to burn more calories than you eat, you lose weight. Exercise also reduces body fat and builds muscle. The more muscle you have, the more calories you burn. Exercise also:  Improves mood.  Reduces stress and tension.  Improves your overall fitness, flexibility, and endurance.  Increases bone strength. The amount of exercise you need to lose weight depends on:  Your age.  The type of exercise.  Any health conditions you have.  Your overall physical ability. Talk to your health care provider about how much exercise you need and what types of activities are safe for you. What actions can I take to lose weight? Nutrition   Make changes to your diet as told by your health care provider or diet and nutrition specialist (dietitian). This may include: ? Eating fewer calories. ? Eating more protein. ? Eating less unhealthy fats. ? Eating a diet that includes fresh fruits and vegetables, whole grains, low-fat dairy products, and lean protein. ? Avoiding foods with added fat, salt, and sugar.  Drink plenty of water while you exercise to prevent dehydration or  heat stroke. Activity  Choose an activity that you enjoy and set realistic goals. Your health care provider can help you make an exercise plan that works for you.  Exercise at a moderate or vigorous intensity most days of the week. ? The intensity of exercise may vary from person to person. You can tell how intense a workout is for you by paying attention to your breathing and heartbeat. Most people will notice their breathing and heartbeat get faster with more  intense exercise.  Do resistance training twice each week, such as: ? Push-ups. ? Sit-ups. ? Lifting weights. ? Using resistance bands.  Getting short amounts of exercise can be just as helpful as long structured periods of exercise. If you have trouble finding time to exercise, try to include exercise in your daily routine. ? Get up, stretch, and walk around every 30 minutes throughout the day. ? Go for a walk during your lunch break. ? Park your car farther away from your destination. ? If you take public transportation, get off one stop early and walk the rest of the way. ? Make phone calls while standing up and walking around. ? Take the stairs instead of elevators or escalators.  Wear comfortable clothes and shoes with good support.  Do not exercise so much that you hurt yourself, feel dizzy, or get very short of breath. Where to find more information  U.S. Department of Health and Human Services: Bonded[employer].at  Centers for Disease Control and Prevention (CDC): http://www.wolf.info/ Contact a health care provider:  Before starting a new exercise program.  If you have questions or concerns about your weight.  If you have a medical problem that keeps you from exercising. Get help right away if you have any of the following while exercising:  Injury.  Dizziness.  Difficulty breathing or shortness of breath that does not go away when you stop exercising.  Chest pain.  Rapid heartbeat. Summary  Being overweight increases your risk of heart disease, stroke, diabetes, high blood pressure, and several types of cancer.  Losing weight happens when you burn more calories than you eat.  Reducing the amount of calories you eat in addition to getting regular moderate or vigorous exercise each week helps you lose weight. This information is not intended to replace advice given to you by your health care provider. Make sure you discuss any questions you have with your health care  provider. Document Revised: 08/28/2017 Document Reviewed: 08/28/2017 Elsevier Patient Education  2020 [employer].

## 2019-09-30 DIAGNOSIS — H2513 Age-related nuclear cataract, bilateral: Secondary | ICD-10-CM | POA: Diagnosis not present

## 2019-09-30 DIAGNOSIS — H04123 Dry eye syndrome of bilateral lacrimal glands: Secondary | ICD-10-CM | POA: Diagnosis not present

## 2019-09-30 DIAGNOSIS — H11153 Pinguecula, bilateral: Secondary | ICD-10-CM | POA: Diagnosis not present

## 2019-10-06 ENCOUNTER — Other Ambulatory Visit: Payer: Self-pay | Admitting: Physician Assistant

## 2019-10-14 ENCOUNTER — Other Ambulatory Visit: Payer: Self-pay | Admitting: Cardiovascular Disease

## 2019-11-14 ENCOUNTER — Other Ambulatory Visit: Payer: Self-pay | Admitting: Cardiovascular Disease

## 2019-12-03 ENCOUNTER — Other Ambulatory Visit: Payer: Self-pay | Admitting: Cardiovascular Disease

## 2019-12-17 DIAGNOSIS — F411 Generalized anxiety disorder: Secondary | ICD-10-CM | POA: Diagnosis not present

## 2020-01-08 DIAGNOSIS — F411 Generalized anxiety disorder: Secondary | ICD-10-CM | POA: Diagnosis not present

## 2020-02-21 DIAGNOSIS — F411 Generalized anxiety disorder: Secondary | ICD-10-CM | POA: Diagnosis not present

## 2020-03-17 ENCOUNTER — Encounter: Payer: Self-pay | Admitting: Gastroenterology

## 2020-05-06 ENCOUNTER — Ambulatory Visit (AMBULATORY_SURGERY_CENTER): Payer: Self-pay | Admitting: *Deleted

## 2020-05-06 ENCOUNTER — Other Ambulatory Visit: Payer: Self-pay

## 2020-05-06 VITALS — Ht 66.0 in | Wt 139.0 lb

## 2020-05-06 DIAGNOSIS — Z800 Family history of malignant neoplasm of digestive organs: Secondary | ICD-10-CM

## 2020-05-06 MED ORDER — PLENVU 140 G PO SOLR: 1.0000 | ORAL | 0 refills | Status: DC

## 2020-05-06 NOTE — Progress Notes (Signed)
cov vax x 2  No egg or soy allergy known to patient  No issues with past sedation with any surgeries or procedures no intubation problems in the past  No FH of Malignant Hyperthermia No diet pills per patient No home 02 use per patient  No blood thinners per patient  Pt denies issues with constipation - states issues on and off- uses Miralax as needed  No A fib or A flutter  EMMI video to pt or via [place] 19 guidelines implemented in [place] today with Pt and RN   Plenvu  Coupon given to pt in [place] today , Code to Pharmacy   Due to the COVID-19 pandemic we are asking patients to follow these guidelines. Please only bring one care partner. Please be aware that your care partner may wait in the car in the parking lot or if they feel like they will be too hot to wait in the car, they may wait in the lobby on the 4th floor. All care partners are required to wear a mask the entire time (we do not have any that we can provide them), they need to practice social distancing, and we will do a Covid check for all patient's and care partners when you arrive. Also we will check their temperature and your temperature. If the care partner waits in their car they need to stay in the parking lot the entire time and we will call them on their cell phone when the patient is ready for discharge so they can bring the car to the front of the building. Also all patient's will need to wear a mask into building.

## 2020-05-07 ENCOUNTER — Encounter: Payer: Self-pay | Admitting: Gastroenterology

## 2020-05-20 ENCOUNTER — Other Ambulatory Visit: Payer: Self-pay

## 2020-05-20 ENCOUNTER — Encounter: Payer: Self-pay | Admitting: Gastroenterology

## 2020-05-20 ENCOUNTER — Ambulatory Visit (AMBULATORY_SURGERY_CENTER): Payer: BC Managed Care – PPO | Admitting: Gastroenterology

## 2020-05-20 VITALS — BP 123/78 | HR 71 | Temp 96.9°F | Resp 14 | Ht 66.0 in | Wt 139.0 lb

## 2020-05-20 DIAGNOSIS — D123 Benign neoplasm of transverse colon: Secondary | ICD-10-CM | POA: Diagnosis not present

## 2020-05-20 DIAGNOSIS — Z1211 Encounter for screening for malignant neoplasm of colon: Secondary | ICD-10-CM

## 2020-05-20 DIAGNOSIS — D122 Benign neoplasm of ascending colon: Secondary | ICD-10-CM | POA: Diagnosis not present

## 2020-05-20 DIAGNOSIS — Z800 Family history of malignant neoplasm of digestive organs: Secondary | ICD-10-CM | POA: Diagnosis not present

## 2020-05-20 MED ORDER — SODIUM CHLORIDE 0.9 % IV SOLN: 500.0000 mL | Freq: Once | INTRAVENOUS | Status: DC

## 2020-05-20 NOTE — Patient Instructions (Signed)
Handouts given:  Diverticulosis, Hemorrhoids, Polyps  Repeat colonoscopy in 5 years Resume previous diet Continue current medications Await pathology    YOU HAD AN ENDOSCOPIC PROCEDURE TODAY AT [facility]:   Refer to the procedure report that was given to you for any specific questions about what was found during the examination.  If the procedure report does not answer your questions, please call your gastroenterologist to clarify.  If you requested that your care partner not be given the details of your procedure findings, then the procedure report has been included in a sealed envelope for you to review at your convenience later.  YOU SHOULD EXPECT: Some feelings of bloating in the abdomen. Passage of more gas than usual.  Walking can help get rid of the air that was put into your GI tract during the procedure and reduce the bloating. If you had a lower endoscopy (such as a colonoscopy or flexible sigmoidoscopy) you may notice spotting of blood in your stool or on the toilet paper. If you underwent a bowel prep for your procedure, you may not have a normal bowel movement for a few days.  Please Note:  You might notice some irritation and congestion in your nose or some drainage.  This is from the oxygen used during your procedure.  There is no need for concern and it should clear up in a day or so.  SYMPTOMS TO REPORT IMMEDIATELY:   Following lower endoscopy (colonoscopy or flexible sigmoidoscopy):  Excessive amounts of blood in the stool  Significant tenderness or worsening of abdominal pains  Swelling of the abdomen that is new, acute  Fever of 100F or higher  For urgent or emergent issues, a gastroenterologist can be reached at any hour by calling [phone]. Do not use MyChart messaging for urgent concerns.    DIET:  We do recommend a small meal at first, but then you may proceed to your regular diet.  Drink plenty of fluids but you should avoid alcoholic  beverages for 24 hours.  ACTIVITY:  You should plan to take it easy for the rest of today and you should NOT DRIVE or use heavy machinery until tomorrow (because of the sedation medicines used during the test).    FOLLOW UP: Our staff will call the number listed on your records 48-72 hours following your procedure to check on you and address any questions or concerns that you may have regarding the information given to you following your procedure. If we do not reach you, we will leave a message.  We will attempt to reach you two times.  During this call, we will ask if you have developed any symptoms of COVID 19. If you develop any symptoms (ie: fever, flu-like symptoms, shortness of breath, cough etc.) before then, please call [phone].  If you test positive for Covid 19 in the 2 weeks post procedure, please call and report this information to [place].    If any biopsies were taken you will be contacted by phone or by letter within the next 1-3 weeks.  Please call us at [phone] if you have not heard about the biopsies in 3 weeks.    SIGNATURES/CONFIDENTIALITY: You and/or your care partner have signed paperwork which will be entered into your electronic medical record.  These signatures attest to the fact that that the information above on your After Visit Summary has been reviewed and is understood.  Full responsibility of the confidentiality of this discharge information lies with  you and/or your care-partner.

## 2020-05-20 NOTE — Progress Notes (Signed)
Pt's states no medical or surgical changes since previsit or office visit. 

## 2020-05-20 NOTE — Progress Notes (Signed)
Called to room to assist during endoscopic procedure.  Patient ID and intended procedure confirmed with present staff. Received instructions for my participation in the procedure from the performing physician.  

## 2020-05-20 NOTE — Op Note (Signed)
[facility] Patient Name: [place] Procedure Date: 05/20/2020 10:42 AM MRN: [id number] Endoscopist: Ladene Artist , MD Age: 59 Referring MD:  Date of Birth: Nov 09, 1960 Gender: Female Account #: [id number] Procedure:                Colonoscopy Indications:              Screening in patient at increased risk: Family                            history of 1st-degree relative with colorectal                            cancer Medicines:                Monitored Anesthesia Care Procedure:                Pre-Anesthesia Assessment:                           - Prior to the procedure, a History and Physical                            was performed, and patient medications and                            allergies were reviewed. The patient's tolerance of                            previous anesthesia was also reviewed. The risks                            and benefits of the procedure and the sedation                            options and risks were discussed with the patient.                            All questions were answered, and informed consent                            was obtained. Prior Anticoagulants: The patient has                            taken no previous anticoagulant or antiplatelet                            agents. ASA Grade Assessment: II - A patient with                            mild systemic disease. After reviewing the risks                            and benefits, the patient was deemed in  satisfactory condition to undergo the procedure.                           After obtaining informed consent, the colonoscope                            was passed under direct vision. Throughout the                            procedure, the patient's blood pressure, pulse, and                            oxygen saturations were monitored continuously. The                            Colonoscope was introduced through the anus and                             advanced to the the cecum, identified by                            appendiceal orifice and ileocecal valve. The                            ileocecal valve, appendiceal orifice, and rectum                            were photographed. The quality of the bowel                            preparation was excellent. The colonoscopy was                            performed without difficulty. The patient tolerated                            the procedure well. Scope In: 10:51:24 AM Scope Out: 11:07:55 AM Scope Withdrawal Time: 0 hours 12 minutes 38 seconds  Total Procedure Duration: 0 hours 16 minutes 31 seconds  Findings:                 The perianal and digital rectal examinations were                            normal.                           A 7 mm polyp was found in the ascending colon. The                            polyp was sessile. The polyp was removed with a                            cold snare. Resection and retrieval were complete.  A 4 mm polyp was found in the transverse colon. The                            polyp was sessile. The polyp was removed with a                            cold biopsy forceps. Resection and retrieval were                            complete.                           A few small-mouthed diverticula were found in the                            sigmoid colon. There was no evidence of                            diverticular bleeding.                           Internal hemorrhoids were found during                            retroflexion. The hemorrhoids were small and Grade                            I (internal hemorrhoids that do not prolapse).                           The exam was otherwise without abnormality on                            direct and retroflexion views. Complications:            No immediate complications. Estimated blood loss:                            None. Estimated Blood  Loss:     Estimated blood loss: none. Impression:               - One 7 mm polyp in the ascending colon, removed                            with a cold snare. Resected and retrieved.                           - One 4 mm polyp in the transverse colon, removed                            with a cold biopsy forceps. Resected and retrieved.                           - Very mild diverticulosis in the sigmoid colon.                           -  Internal hemorrhoids.                           - The examination was otherwise normal on direct                            and retroflexion views. Recommendation:           - Repeat colonoscopy in 5 years for                            surveillance/screening.                           - Patient has a contact number available for                            emergencies. The signs and symptoms of potential                            delayed complications were discussed with the                            patient. Return to normal activities tomorrow.                            Written discharge instructions were provided to the                            patient.                           - Resume previous diet.                           - Continue present medications.                           - Await pathology results. Ladene Artist, MD 05/20/2020 11:11:29 AM This report has been signed electronically.

## 2020-05-20 NOTE — Progress Notes (Signed)
To PACU, VSS. Report to [email or web address] 

## 2020-05-22 ENCOUNTER — Telehealth: Payer: Self-pay

## 2020-05-22 NOTE — Telephone Encounter (Signed)
  Follow up Call-  Call back number 05/20/2020  Post procedure Call Back phone  # [phone]  Permission to leave phone message Yes  Some recent data might be hidden     Patient questions:  Do you have a fever, pain , or abdominal swelling? No. Pain Score  0 *  Have you tolerated food without any problems? Yes.    Have you been able to return to your normal activities? Yes.    Do you have any questions about your discharge instructions: Diet   No. Medications  No. Follow up visit  No.  Do you have questions or concerns about your Care? No.  Actions: * If pain score is 4 or above: No action needed, pain <4.   1. Have you developed a fever since your procedure? no  2.   Have you had an respiratory symptoms (SOB or cough) since your procedure? no  3.   Have you tested positive for COVID 19 since your procedure no  4.   Have you had any family members/close contacts diagnosed with the COVID 19 since your procedure?  no   If yes to any of these questions please route to Joylene John, RN and Joella Prince, RN

## 2020-05-29 ENCOUNTER — Encounter: Payer: Self-pay | Admitting: Gastroenterology

## 2020-06-01 DIAGNOSIS — Z6822 Body mass index (BMI) 22.0-22.9, adult: Secondary | ICD-10-CM | POA: Diagnosis not present

## 2020-06-01 DIAGNOSIS — Z01419 Encounter for gynecological examination (general) (routine) without abnormal findings: Secondary | ICD-10-CM | POA: Diagnosis not present

## 2020-06-01 DIAGNOSIS — Z1231 Encounter for screening mammogram for malignant neoplasm of breast: Secondary | ICD-10-CM | POA: Diagnosis not present

## 2020-06-29 DIAGNOSIS — Z13820 Encounter for screening for osteoporosis: Secondary | ICD-10-CM | POA: Diagnosis not present

## 2020-08-24 DIAGNOSIS — Z20822 Contact with and (suspected) exposure to covid-19: Secondary | ICD-10-CM | POA: Diagnosis not present

## 2020-08-25 DIAGNOSIS — M25551 Pain in right hip: Secondary | ICD-10-CM | POA: Diagnosis not present

## 2020-08-30 ENCOUNTER — Encounter: Payer: Self-pay | Admitting: Cardiovascular Disease

## 2020-08-30 NOTE — Progress Notes (Signed)
Katrina [name] Date of Birth  07/08/1961 [facility]     [place]  [address]    [address]   [address] [place], [place]  [address]    [place], [place]  [address] [phone]  Fax  [phone]  [phone]  Fax [phone]  Problem List: 1. Hypetension 2. Mitral valve prolapse.  3. Hyperlipidemia   Previous Notes   Katrina [name] is a 60 yo who presents today for evaluation of HTN.  She was noted to have elevation of her diastolic blood pressure.  She's also been having some dyspnea with exertion. She went hiking at the [facility] [name] recently and had significant shortness breath climbing up the trail.  She feels like her metabolism has slowed recently.  She walks her dogs 2 miles a day without problems.   She has been working out and walking regularly.    November 09, 2012:  Katrina [name] was tried on Losartan - she was feeling poorly and her primary recommended that she stop.  She stopped the Losartan and is feeling better.   December 01, 2015:  Katrina [name] is seen back today after 2 year absence. Doing ok Had some pleurisy last month.  BP was a little elevated with that .  Has had some heart racing when traveling [place].   Walk every day.     Feb. 2, 2018: Doing well. No complaints  Not walking as much  Went to [place] this past summer   BP has been elevated.   Oct. 1, 2018:  Katrina [name] is seen today  Doing well. Walking regularly,  No CP or dyspnea  Has some allergies  LDL was 106 last week - takes Atorvastatin 10 mg a day   October 08, 2018:  Katrina [name] was seen in the emergency room on January 20 with an episode of presyncope.  It was thought to be most likely due to orthostatic hypotension.  Seem to be secondary to her HCTZ therapy.  We decreased the hydrochlorothiazide to 12.5 mg a day.  She is doing better at hydrating Had an occular migraine HR on Jan. 23. No further episodes of syncope or near syncope  Is on amlodipine - partly due to  Raynauds.  Will change it to Dilt. CD 120 today   Jan. 5, 2021  Katrina [name] is seen today . Staying active.   Work is busy .  No CP or dyspnea.    Jan. 3, 2022: Katrina [name] is seen today for follow up of her MVP, HTN, HLD  Has Raynaud syndrome - on amlodipine Feels well  Is gaining some weight , trying to exercise ,  Working on diet .  Wt is 144 lbs.     Current Outpatient Medications on File Prior to Visit  Medication Sig Dispense Refill  . atorvastatin (LIPITOR) 20 MG tablet TAKE 1 TABLET BY MOUTH DAILY. 90 tablet 2  . B Complex Vitamins (VITAMIN-B COMPLEX PO) Take by mouth.    . Cholecalciferol (VITAMIN D3) 5000 units TABS Take 1 tablet by mouth 2 (two) times daily.    Marland Kitchen diltiazem (CARDIZEM CD) 120 MG 24 hr capsule TAKE (1) CAPSULE DAILY. 90 capsule 3  . hydrochlorothiazide (HYDRODIURIL) 25 MG tablet TAKE (1/2) TABLET DAILY. 45 tablet 3  . lisinopril (ZESTRIL) 10 MG tablet TAKE 1 TABLET ONCE DAILY. 90 tablet 3  . loratadine (CLARITIN) 10 MG tablet Take 10 mg by mouth daily.    . polyethylene glycol powder (MIRALAX) 17 GM/SCOOP powder Take 1  Container by mouth as needed.    . potassium chloride SA (KLOR-CON) 20 MEQ tablet TAKE 1 TABLET ONCE DAILY. 90 tablet 3   No current facility-administered medications on file prior to visit.    Allergies  Allergen Reactions  . Cefuroxime Diarrhea and Nausea And Vomiting    Caused C Dif    Past Medical History:  Diagnosis Date  . Allergy   . Anal fissure   . Arthritis    not dx'd  but in hands   . C. difficile diarrhea    04/2013  . Constipation    chronic her entire life  . Diverticulosis   . Family history of breast cancer in first degree relative   . Frozen shoulder    history of bilateral frozen shoulder  . GERD (gastroesophageal reflux disease)    past hx- many years ago   . Headache(784.0)   . History of abnormal Pap smear    S/P lazer conization  . History of stress test    Stress echocardiogram 7/19: Normal clinical, ECG and  echo response to stress.  Note very frequent monomorphic PVCs especially in early recovery.  . Hyperlipidemia    controlled   . Hypertension    controlled   . Lupus anticoagulant disorder ([place])    refuted by hematologist in 2014  . Menopause   . Mitral regurgitation    Echo 6/19:  EF 60-65, no RWMA, Gr 1 DD, mild post leaflet MVP, mod MR, normal RVSF, mild TR // Echocardiogram 06/2019: EF 60-65, myxomatous mitral valve with mild bileaflet prolapse and mod MR, mild AoV sclerosis (no AS), RVSP 30   . Mitral valve prolapse    a. echo 01/2009 showed EF 123456, systolic bowing of mitral valve without prolapse.   . Near syncope 09/17/2018  . Orthostatic hypotension 09/17/2018  . Osteopenia   . Raynaud disease    a. question false positive antiphospholipid panel-> sx later felt due to Raynaud itself (transient finger discoloration) rather than embolic phenomena.  . Vitamin D deficiency     Past Surgical History:  Procedure Laterality Date  . COLONOSCOPY    . FOOT SURGERY  2007   left  . KNEE SURGERY Bilateral 2006   for torn meniscus   . WISDOM TOOTH EXTRACTION  1981    Social History   Tobacco Use  Smoking Status Former Smoker  . Packs/day: 1.00  . Years: 6.00  . Pack years: 6.00  . Types: Cigarettes  . Quit date: 07/02/1991  . Years since quitting: 29.1  Smokeless Tobacco Never Used    Social History   Substance and Sexual Activity  Alcohol Use Yes   Comment: 1-2 glasses of wine occ     Family History  Problem Relation Age of Onset  . Colon cancer Mother 48  . Breast cancer Mother   . Hypertension Mother   . Anemia Father   . Heart disease Father        CHF  . Lung disease Father   . Atrial fibrillation Father   . COPD Father   . Breast cancer Maternal Aunt   . Cancer Paternal Aunt        brain  . Heart disease Maternal Grandmother   . Cancer Maternal Grandfather        throat  . Esophageal cancer Maternal Grandfather   . Stroke Sister   . Lymphoma Brother    . Irritable bowel syndrome Daughter   . Other Daughter  Primary Sclerosing Cholangitis  . Heart attack Neg Hx   . Colon polyps Neg Hx   . Rectal cancer Neg Hx   . Stomach cancer Neg Hx     Reviw of Systems:  Reviewed in the HPI.  All other systems are negative.  Physical Exam: There were no vitals taken for this visit.  GEN:  Well nourished, well developed in no acute distress HEENT: Normal NECK: No JVD; No carotid bruits LYMPHATICS: No lymphadenopathy CARDIAC: RRR , 2/6 systolic ejection murmur just  inferior to her left breast with some radiation to the axilla.   RESPIRATORY:  Clear to auscultation without rales, wheezing or rhonchi  ABDOMEN: Soft, non-tender, non-distended MUSCULOSKELETAL:  No edema; No deformity  SKIN: Warm and dry NEUROLOGIC:  Alert and oriented x 3    ECG:  Jan. 3, 2022 :  NSR at 64.   No ST or T abn.    Assessment / Plan:    1. Hypertension -  BP is well controlled.    2. Mitral valve prolapse.   Her MVP murmur is unchanged.  She has moderate mitral valve prolapse with moderate mitral rotation by echo several years ago.  We will continue to see her yearly.   3. Hyperlipidemia- check lipids, liver, bmp today      4.  Raynaud's disease: He she is not having any current symptoms.  She is careful about keeping her hands warm.  Continue diltiazem.   Mertie Moores, MD  08/30/2020 1:27 PM    [facility] [place],  [place], [place]  [address] Pager [phone] Phone: [phone]; Fax: [phone]

## 2020-08-31 ENCOUNTER — Other Ambulatory Visit: Payer: Self-pay

## 2020-08-31 ENCOUNTER — Encounter: Payer: Self-pay | Admitting: Cardiovascular Disease

## 2020-08-31 ENCOUNTER — Ambulatory Visit: Payer: BC Managed Care – PPO | Admitting: Cardiovascular Disease

## 2020-08-31 VITALS — BP 134/78 | HR 64 | Ht 66.0 in | Wt 144.6 lb

## 2020-08-31 DIAGNOSIS — E782 Mixed hyperlipidemia: Secondary | ICD-10-CM | POA: Diagnosis not present

## 2020-08-31 DIAGNOSIS — I341 Nonrheumatic mitral (valve) prolapse: Secondary | ICD-10-CM

## 2020-08-31 NOTE — Patient Instructions (Signed)
Medication Instructions:  Your physician recommends that you continue on your current medications as directed. Please refer to the Current Medication list given to you today.  *If you need a refill on your cardiac medications before your next appointment, please call your pharmacy*   Lab Work: BMET, Liver Panel and Lipid Panel If you have labs (blood work) drawn today and your tests are completely normal, you will receive your results only by: Marland Kitchen MyChart Message (if you have MyChart) OR . A paper copy in the mail If you have any lab test that is abnormal or we need to change your treatment, we will call you to review the results.   Testing/Procedures: None ordered.    Follow-Up: At [facility], you and your health needs are our priority.  As part of our continuing mission to provide you with exceptional heart care, we have created designated Provider Care Teams.  These Care Teams include your primary Cardiologist (physician) and Advanced Practice Providers (APPs -  Physician Assistants and Nurse Practitioners) who all work together to provide you with the care you need, when you need it.  We recommend signing up for the patient portal called "MyChart".  Sign up information is provided on this After Visit Summary.  MyChart is used to connect with patients for Virtual Visits (Telemedicine).  Patients are able to view lab/test results, encounter notes, upcoming appointments, etc.  Non-urgent messages can be sent to your provider as well.   To learn more about what you can do with MyChart, go to [email or web address].    Your next appointment:   12 month(s)  The format for your next appointment:   In Person  Provider:   Kristeen Miss, MD

## 2020-09-01 LAB — BASIC METABOLIC PANEL
BUN/Creatinine Ratio: 19 (ref 12–28)
BUN: 16 mg/dL (ref 8–27)
CO2: 25 mmol/L (ref 20–29)
Calcium: 9.9 mg/dL (ref 8.7–10.3)
Chloride: 96 mmol/L (ref 96–106)
Creatinine, Ser: 0.84 mg/dL (ref 0.57–1.00)
GFR calc Af Amer: 87 mL/min/{1.73_m2} (ref >59)
GFR calc non Af Amer: 76 mL/min/{1.73_m2} (ref >59)
Glucose: 93 mg/dL (ref 65–99)
Potassium: 4.8 mmol/L (ref 3.5–5.2)
Sodium: 134 mmol/L (ref 134–144)

## 2020-09-01 LAB — HEPATIC FUNCTION PANEL
ALT: 24 IU/L (ref 0–32)
AST: 21 IU/L (ref 0–40)
Albumin: 4.8 g/dL (ref 3.8–4.9)
Alkaline Phosphatase: 57 IU/L (ref 44–121)
Bilirubin Total: <0.2 mg/dL (ref 0.0–1.2)
Bilirubin, Direct: <0.1 mg/dL (ref 0.00–0.40)
Total Protein: 7 g/dL (ref 6.0–8.5)

## 2020-09-01 LAB — LIPID PANEL
Chol/HDL Ratio: 2.4 ratio (ref 0.0–4.4)
Cholesterol, Total: 198 mg/dL (ref 100–199)
HDL: 83 mg/dL (ref >39)
LDL Chol Calc (NIH): 96 mg/dL (ref 0–99)
Triglycerides: 108 mg/dL (ref 0–149)
VLDL Cholesterol Cal: 19 mg/dL (ref 5–40)

## 2020-09-09 DIAGNOSIS — M25551 Pain in right hip: Secondary | ICD-10-CM | POA: Diagnosis not present

## 2020-09-15 ENCOUNTER — Other Ambulatory Visit: Payer: Self-pay | Admitting: Cardiovascular Disease

## 2020-10-14 ENCOUNTER — Other Ambulatory Visit: Payer: Self-pay | Admitting: Physician Assistant

## 2020-10-19 ENCOUNTER — Other Ambulatory Visit: Payer: Self-pay | Admitting: Cardiovascular Disease

## 2020-12-07 ENCOUNTER — Other Ambulatory Visit: Payer: Self-pay | Admitting: Cardiovascular Disease

## 2021-06-26 ENCOUNTER — Other Ambulatory Visit: Payer: Self-pay | Admitting: Cardiovascular Disease

## 2021-07-14 DIAGNOSIS — M542 Cervicalgia: Secondary | ICD-10-CM | POA: Diagnosis not present

## 2021-07-14 DIAGNOSIS — M25511 Pain in right shoulder: Secondary | ICD-10-CM | POA: Diagnosis not present

## 2021-07-30 DIAGNOSIS — M19011 Primary osteoarthritis, right shoulder: Secondary | ICD-10-CM | POA: Diagnosis not present

## 2021-07-30 DIAGNOSIS — M4722 Other spondylosis with radiculopathy, cervical region: Secondary | ICD-10-CM | POA: Diagnosis not present

## 2021-08-13 DIAGNOSIS — M25511 Pain in right shoulder: Secondary | ICD-10-CM | POA: Diagnosis not present

## 2021-09-16 DIAGNOSIS — L821 Other seborrheic keratosis: Secondary | ICD-10-CM | POA: Diagnosis not present

## 2021-09-16 DIAGNOSIS — L814 Other melanin hyperpigmentation: Secondary | ICD-10-CM | POA: Diagnosis not present

## 2021-09-16 DIAGNOSIS — D1801 Hemangioma of skin and subcutaneous tissue: Secondary | ICD-10-CM | POA: Diagnosis not present

## 2021-10-01 ENCOUNTER — Other Ambulatory Visit: Payer: Self-pay | Admitting: Cardiovascular Disease

## 2021-10-05 ENCOUNTER — Telehealth (INDEPENDENT_AMBULATORY_CARE_PROVIDER_SITE_OTHER): Payer: BC Managed Care – PPO | Admitting: Family Medicine

## 2021-10-05 ENCOUNTER — Encounter: Payer: Self-pay | Admitting: Family Medicine

## 2021-10-05 VITALS — Temp 98.6°F | Ht 66.0 in | Wt 144.6 lb

## 2021-10-05 DIAGNOSIS — R0981 Nasal congestion: Secondary | ICD-10-CM

## 2021-10-05 DIAGNOSIS — R059 Cough, unspecified: Secondary | ICD-10-CM

## 2021-10-05 MED ORDER — ALBUTEROL SULFATE HFA 108 (90 BASE) MCG/ACT IN AERS: 2.0000 | INHALATION_SPRAY | Freq: Four times a day (QID) | RESPIRATORY_TRACT | 0 refills | Status: DC | PRN

## 2021-10-05 MED ORDER — DOXYCYCLINE HYCLATE 100 MG PO TABS: 100.0000 mg | ORAL_TABLET | Freq: Two times a day (BID) | ORAL | 0 refills | Status: DC

## 2021-10-05 NOTE — Progress Notes (Signed)
Virtual Visit via Video Note  I connected with Katrina [name]  on 10/05/21 at 11:00 AM EST by a video enabled telemedicine application and verified that I am speaking with the correct person using two identifiers.  Location patient: [place] Location provider:work or home office Persons participating in the virtual visit: patient, provider  I discussed the limitations and requested verbal permission for telemedicine visit. The patient expressed understanding and agreed to proceed.   HPI:  Acute telemedicine visit for : -Onset:about 2.5 week ago -Symptoms include: she thought it was allergies initially, "super congested nasal passages', cough, now worse with yellow phlegm, sinus discomfort, feels tired, some SOB at times -Denies:CP, NVD, fever -Pertinent past medical history: see below, has had covid in the past, has required alb in the past when sick - inhaler expired -Pertinent medication allergies: Allergies  Allergen Reactions   Cefuroxime Diarrhea and Nausea And Vomiting    Caused C Dif  -COVID-19 vaccine status:  Immunization History  Administered Date(s) Administered   Influenza Whole 08/09/2004, 07/01/2010   Influenza,inj,Quad PF,6+ Mos 07/14/2015   Influenza-Unspecified 05/29/2016, 06/07/2018   Tdap 01/12/2015     ROS: See pertinent positives and negatives per HPI.  Past Medical History:  Diagnosis Date   Allergy    Anal fissure    Arthritis    not dx'd  but in hands    C. difficile diarrhea    04/2013   Constipation    chronic her entire life   Diverticulosis    Family history of breast cancer in first degree relative    Frozen shoulder    history of bilateral frozen shoulder   GERD (gastroesophageal reflux disease)    past hx- many years ago    Headache(784.0)    History of abnormal Pap smear    S/P lazer conization   History of stress test    Stress echocardiogram 7/19: Normal clinical, ECG and echo response to stress.  Note very frequent monomorphic PVCs especially  in early recovery.   Hyperlipidemia    controlled    Hypertension    controlled    Lupus anticoagulant disorder ([place])    refuted by hematologist in 2014   Menopause    Mitral regurgitation    Echo 6/19:  EF 60-65, no RWMA, Gr 1 DD, mild post leaflet MVP, mod MR, normal RVSF, mild TR // Echocardiogram 06/2019: EF 60-65, myxomatous mitral valve with mild bileaflet prolapse and mod MR, mild AoV sclerosis (no AS), RVSP 30    Mitral valve prolapse    a. echo 01/2009 showed EF 94-70%, systolic bowing of mitral valve without prolapse.    Near syncope 09/17/2018   Orthostatic hypotension 09/17/2018   Osteopenia    Raynaud disease    a. question false positive antiphospholipid panel-> sx later felt due to Raynaud itself (transient finger discoloration) rather than embolic phenomena.   Vitamin D deficiency     Past Surgical History:  Procedure Laterality Date   COLONOSCOPY     FOOT SURGERY  2007   left   KNEE SURGERY Bilateral 2006   for torn meniscus    WISDOM TOOTH EXTRACTION  1981     Current Outpatient Medications:    albuterol (PROAIR HFA) 108 (90 Base) MCG/ACT inhaler, Inhale 2 puffs into the lungs every 6 (six) hours as needed for wheezing or shortness of breath., Disp: 1 each, Rfl: 0   atorvastatin (LIPITOR) 20 MG tablet, TAKE 1 TABLET BY MOUTH DAILY., Disp: 90 tablet, Rfl: 0   B  Complex Vitamins (VITAMIN-B COMPLEX PO), Take by mouth., Disp: , Rfl:    Cholecalciferol (VITAMIN D3) 5000 units TABS, Take 1 tablet by mouth 2 (two) times daily., Disp: , Rfl:    diltiazem (CARDIZEM CD) 120 MG 24 hr capsule, TAKE (1) CAPSULE DAILY., Disp: 90 capsule, Rfl: 3   doxycycline (VIBRA-TABS) 100 MG tablet, Take 1 tablet (100 mg total) by mouth 2 (two) times daily., Disp: 20 tablet, Rfl: 0   hydrochlorothiazide (HYDRODIURIL) 25 MG tablet, TAKE (1/2) TABLET DAILY., Disp: 45 tablet, Rfl: 3   lisinopril (ZESTRIL) 10 MG tablet, TAKE ONE TABLET BY MOUTH DAILY., Disp: 90 tablet, Rfl: 0   loratadine  (CLARITIN) 10 MG tablet, Take 10 mg by mouth daily., Disp: , Rfl:    polyethylene glycol powder (GLYCOLAX/MIRALAX) 17 GM/SCOOP powder, Take 1 Container by mouth as needed., Disp: , Rfl:    potassium chloride SA (KLOR-CON) 20 MEQ tablet, TAKE ONE TABLET BY MOUTH ONCE DAILY., Disp: 60 tablet, Rfl: 1  EXAM:  VITALS per patient if applicable:  GENERAL: alert, oriented, appears well and in no acute distress  HEENT: atraumatic, conjunttiva clear, no obvious abnormalities on inspection of external nose and ears  NECK: normal movements of the head and neck  LUNGS: on inspection no signs of respiratory distress, breathing rate appears normal, no obvious gross SOB, gasping or wheezing  CV: no obvious cyanosis  MS: moves all visible extremities without noticeable abnormality  PSYCH/NEURO: pleasant and cooperative, no obvious depression or anxiety, speech and thought processing grossly intact  ASSESSMENT AND PLAN:  Discussed the following assessment and plan:  Nasal congestion  Cough, unspecified type  -we discussed possible serious and likely etiologies, options for evaluation and workup, limitations of telemedicine visit vs in person visit, treatment, treatment risks and precautions. Pt is agreeable to treatment via telemedicine at this moment. Given duration of symptoms and worsening suspect 2ndary bacterial resp illness possible vs other. She has opted to try empiric tx with doxy 100mg  bid x 7-10 daysand alb 2 puffs q 6 hours prn/ Advised to seek prompt virtual visit or in person care if worsening, new symptoms arise, or if is not improving with treatment as expected per our conversation of expected course.  I discussed the assessment and treatment plan with the patient. The patient was provided an opportunity to ask questions and all were answered. The patient agreed with the plan and demonstrated an understanding of the instructions.     Lucretia Kern, DO

## 2021-10-05 NOTE — Patient Instructions (Signed)
-  I sent the medication(s) we discussed to your pharmacy: Meds ordered this encounter  Medications   doxycycline (VIBRA-TABS) 100 MG tablet    Sig: Take 1 tablet (100 mg total) by mouth 2 (two) times daily.    Dispense:  20 tablet    Refill:  0   albuterol (PROAIR HFA) 108 (90 Base) MCG/ACT inhaler    Sig: Inhale 2 puffs into the lungs every 6 (six) hours as needed for wheezing or shortness of breath.    Dispense:  1 each    Refill:  0     I hope you are feeling better soon!  Seek in person care promptly if your symptoms worsen, new concerns arise or you are not improving with treatment.  It was nice to meet you today. I help [facility] out with telemedicine visits on Tuesdays and Thursdays and am happy to help if you need a virtual follow up visit on those days. Otherwise, if you have any concerns or questions following this visit please schedule a follow up visit with your Primary Care office or seek care at a local urgent care clinic to avoid delays in care

## 2021-11-02 ENCOUNTER — Other Ambulatory Visit: Payer: Self-pay | Admitting: Cardiovascular Disease

## 2021-12-09 ENCOUNTER — Encounter: Payer: Self-pay | Admitting: Cardiovascular Disease

## 2021-12-09 ENCOUNTER — Ambulatory Visit (INDEPENDENT_AMBULATORY_CARE_PROVIDER_SITE_OTHER): Payer: BC Managed Care – PPO | Admitting: Cardiovascular Disease

## 2021-12-09 VITALS — BP 124/68 | HR 70 | Ht 66.0 in | Wt 146.4 lb

## 2021-12-09 DIAGNOSIS — R5383 Other fatigue: Secondary | ICD-10-CM | POA: Diagnosis not present

## 2021-12-09 DIAGNOSIS — I10 Essential (primary) hypertension: Secondary | ICD-10-CM | POA: Diagnosis not present

## 2021-12-09 DIAGNOSIS — E782 Mixed hyperlipidemia: Secondary | ICD-10-CM

## 2021-12-09 LAB — ALT: ALT: 19 IU/L (ref 0–32)

## 2021-12-09 LAB — BASIC METABOLIC PANEL
BUN/Creatinine Ratio: 18 (ref 12–28)
BUN: 14 mg/dL (ref 8–27)
CO2: 24 mmol/L (ref 20–29)
Calcium: 10.2 mg/dL (ref 8.7–10.3)
Chloride: 100 mmol/L (ref 96–106)
Creatinine, Ser: 0.78 mg/dL (ref 0.57–1.00)
Glucose: 95 mg/dL (ref 70–99)
Potassium: 5.2 mmol/L (ref 3.5–5.2)
Sodium: 137 mmol/L (ref 134–144)
eGFR: 86 mL/min/{1.73_m2} (ref >59)

## 2021-12-09 LAB — LIPID PANEL
Chol/HDL Ratio: 2.3 ratio (ref 0.0–4.4)
Cholesterol, Total: 201 mg/dL — ABNORMAL HIGH (ref 100–199)
HDL: 88 mg/dL (ref >39)
LDL Chol Calc (NIH): 103 mg/dL — ABNORMAL HIGH (ref 0–99)
Triglycerides: 56 mg/dL (ref 0–149)
VLDL Cholesterol Cal: 10 mg/dL (ref 5–40)

## 2021-12-09 LAB — TSH: TSH: 1.81 u[IU]/mL (ref 0.450–4.500)

## 2021-12-09 MED ORDER — HYDROCHLOROTHIAZIDE 12.5 MG PO CAPS: 12.5000 mg | ORAL_CAPSULE | Freq: Every day | ORAL | Status: DC | PRN

## 2021-12-09 MED ORDER — POTASSIUM CHLORIDE ER 10 MEQ PO TBCR: 10.0000 meq | EXTENDED_RELEASE_TABLET | Freq: Two times a day (BID) | ORAL | Status: DC | PRN

## 2021-12-09 NOTE — Patient Instructions (Signed)
Medication Instructions:  ?Your physician has recommended you make the following change in your medication:  ? ?1) CHANGE Hydrochlorothiazide (HCTZ) 12.'5mg'$  daily as needed ?2) CHANGE Potassium Chloride (Klor-Con) 10 meq twice daily as needed ?*If you need a refill on your cardiac medications before your next appointment, please call your pharmacy* ? ?Lab Work: ?TODAY: TSH, Lipids, ALT, BMET ?If you have labs (blood work) drawn today and your tests are completely normal, you will receive your results only by: ?MyChart Message (if you have MyChart) OR ?A paper copy in the mail ?If you have any lab test that is abnormal or we need to change your treatment, we will call you to review the results. ? ?Testing/Procedures: ?NONE ? ?Follow-Up: ?At [facility], you and your health needs are our priority.  As part of our continuing mission to provide you with exceptional heart care, we have created designated Provider Care Teams.  These Care Teams include your primary Cardiologist (physician) and Advanced Practice Providers (APPs -  Physician Assistants and Nurse Practitioners) who all work together to provide you with the care you need, when you need it. ? ?Your next appointment:   ?1 year(s) ? ?The format for your next appointment:   ?In Person ? ?Provider:   ?Mertie Moores, MD { ? ?Other Instructions ?Important Information About Sugar ? ? ? ? ?  ?

## 2021-12-09 NOTE — Progress Notes (Signed)
? ? ?[place] ?Date of Birth  12-23-1960 ?Press photographer     [employer] ? ?[address] ?[place], [place]  [address]    [place],   [address] ?[phone]  Fax  [phone]  [phone]  Fax [phone] ? ?Problem List: ?1. Hypetension ?2. Mitral valve prolapse.  ?3. Hyperlipidemia ? ? ?Previous Notes  ? ?Katrina [name] is a 61 yo who presents today for evaluation of HTN.  She was noted to have elevation of her diastolic blood pressure. ? ?She's also been having some dyspnea with exertion. She went hiking at [facility] recently and had significant shortness breath climbing up the trail.  She feels like her metabolism has slowed recently. ? ?She walks her dogs 2 miles a day without problems.   She has been working out and walking regularly.   ? ?November 09, 2012: ? ?Katrina [name] was tried on Losartan - she was feeling poorly and her primary recommended that she stop.  She stopped the Losartan and is feeling better.  ? ?December 01, 2015: ? ?Katrina [name] is seen back today after 2 year absence. ?Doing ok ?Had some pleurisy last month.  BP was a little elevated with that . ? ?Has had some heart racing when traveling [place].   ?Walk every day.    ? ?Feb. 2, 2018: ?Doing well. ?No complaints  ?Not walking as much  ?Went to [place] this past summer   ?BP has been elevated.  ? ?Oct. 1, 2018: ? ?Katrina [name] is seen today  ?Doing well. ?Walking regularly,  ?No CP or dyspnea  ?Has some allergies  ?LDL was 106 last week - takes Atorvastatin 10 mg a day  ? ?October 08, 2018: ? Katrina [name] was seen in the emergency room on January 20 with an episode of presyncope.  It was thought to be most likely due to orthostatic hypotension.  Seem to be secondary to her HCTZ therapy.  We decreased the hydrochlorothiazide to 12.5 mg a day. ? ?She is doing better at hydrating ?Had an occular migraine HR on Jan. 23. ?No further episodes of syncope or near syncope  ?Is on amlodipine - partly due to  Raynauds.  ?Will change it to Dilt. CD 120 today  ? ?Jan. 5, 2021 ? ?Katrina [name] is seen today . ?Staying active.   Work is busy .  No CP or dyspnea.   ? ?Jan. 3, 2022: ?Katrina [name] is seen today for follow up of her MVP, HTN, HLD  ?Has Raynaud syndrome - on amlodipine ?Feels well  ?Is gaining some weight , trying to exercise ,  Working on diet .  ?Wt is 144 lbs.  ? ?December 09, 2021  ? ?Katrina [name] is seen for follow up of her MVP, HTN, HLD  ?Has Raynaulds syndrome  - is on diltiazem  ?  ?Her Raynaulds has been fairly well controlled  ?Has had some unexplained weight gain and fatiuge  ?Will check TSH  ( brother had thyroid cancer )  ? ?Current Outpatient Medications on File Prior to Visit  ?Medication Sig Dispense Refill  ? albuterol (PROAIR HFA) 108 (90 Base) MCG/ACT inhaler Inhale 2 puffs into the lungs every 6 (six) hours as needed for wheezing or shortness of breath. 1 each 0  ? atorvastatin (LIPITOR) 20 MG tablet TAKE 1 TABLET BY MOUTH DAILY. 90 tablet 0  ? diltiazem (CARDIZEM CD) 120 MG 24 hr capsule TAKE (1) CAPSULE DAILY. 90 capsule 0  ?  lisinopril (ZESTRIL) 10 MG tablet TAKE ONE TABLET BY MOUTH DAILY. 90 tablet 0  ? loratadine (CLARITIN) 10 MG tablet Take 10 mg by mouth daily.    ? polyethylene glycol powder (GLYCOLAX/MIRALAX) 17 GM/SCOOP powder Take 1 Container by mouth as needed.    ? potassium chloride (KLOR-CON) 10 MEQ tablet Take 10 mEq by mouth 2 (two) times daily.    ? B Complex Vitamins (VITAMIN-B COMPLEX PO) Take by mouth. (Patient not taking: Reported on 12/09/2021)    ? Cholecalciferol (VITAMIN D3) 5000 units TABS Take 1 tablet by mouth 2 (two) times daily. (Patient not taking: Reported on 12/09/2021)    ? doxycycline (VIBRA-TABS) 100 MG tablet Take 1 tablet (100 mg total) by mouth 2 (two) times daily. (Patient not taking: Reported on 12/09/2021) 20 tablet 0  ? hydrochlorothiazide (HYDRODIURIL) 25 MG tablet TAKE (1/2) TABLET DAILY. (Patient not taking: Reported on 12/09/2021) 45 tablet 3  ? meloxicam (MOBIC) 15 MG  tablet Take 15 mg by mouth daily. (Patient not taking: Reported on 12/09/2021)    ? ?No current facility-administered medications on file prior to visit.  ? ? ?Allergies  ?Allergen Reactions  ? Cefuroxime Diarrhea and Nausea And Vomiting  ?  Caused C Dif  ? Ciprofloxacin   ? ? ?Past Medical History:  ?Diagnosis Date  ? Allergy   ? Anal fissure   ? Arthritis   ? not dx'd  but in hands   ? C. difficile diarrhea   ? 04/2013  ? Constipation   ? chronic her entire life  ? Diverticulosis   ? Family history of breast cancer in first degree relative   ? Frozen shoulder   ? history of bilateral frozen shoulder  ? GERD (gastroesophageal reflux disease)   ? past hx- many years ago   ? Headache(784.0)   ? History of abnormal Pap smear   ? S/P lazer conization  ? History of stress test   ? Stress echocardiogram 7/19: Normal clinical, ECG and echo response to stress.  Note very frequent monomorphic PVCs especially in early recovery.  ? Hyperlipidemia   ? controlled   ? Hypertension   ? controlled   ? Lupus anticoagulant disorder ([place])   ? refuted by hematologist in 2014  ? Menopause   ? Mitral regurgitation   ? Echo 6/19:  EF 60-65, no RWMA, Gr 1 DD, mild post leaflet MVP, mod MR, normal RVSF, mild TR // Echocardiogram 06/2019: EF 60-65, myxomatous mitral valve with mild bileaflet prolapse and mod MR, mild AoV sclerosis (no AS), RVSP 30   ? Mitral valve prolapse   ? a. echo 01/2009 showed EF 03-00%, systolic bowing of mitral valve without prolapse.   ? Near syncope 09/17/2018  ? Orthostatic hypotension 09/17/2018  ? Osteopenia   ? Raynaud disease   ? a. question false positive antiphospholipid panel-> sx later felt due to Raynaud itself (transient finger discoloration) rather than embolic phenomena.  ? Vitamin D deficiency   ? ? ?Past Surgical History:  ?Procedure Laterality Date  ? COLONOSCOPY    ? FOOT SURGERY  2007  ? left  ? KNEE SURGERY Bilateral 2006  ? for torn meniscus   ? Goodnight EXTRACTION  1981  ? ? ?Social History   ? ?Tobacco Use  ?Smoking Status Former  ? Packs/day: 1.00  ? Years: 6.00  ? Pack years: 6.00  ? Types: Cigarettes  ? Quit date: 07/02/1991  ? Years since quitting: 30.4  ?Smokeless Tobacco Never  ? ? ?  Social History  ? ?Substance and Sexual Activity  ?Alcohol Use Yes  ? Comment: 1-2 glasses of wine occ   ? ? ?Family History  ?Problem Relation Age of Onset  ? Colon cancer Mother 52  ? Breast cancer Mother   ? Hypertension Mother   ? Anemia Father   ? Heart disease Father   ?     CHF  ? Lung disease Father   ? Atrial fibrillation Father   ? COPD Father   ? Breast cancer Maternal Aunt   ? Cancer Paternal Aunt   ?     brain  ? Heart disease Maternal Grandmother   ? Cancer Maternal Grandfather   ?     throat  ? Esophageal cancer Maternal Grandfather   ? Stroke Sister   ? Lymphoma Brother   ? Irritable bowel syndrome Daughter   ? Other Daughter   ?     Primary Sclerosing Cholangitis  ? Heart attack Neg Hx   ? Colon polyps Neg Hx   ? Rectal cancer Neg Hx   ? Stomach cancer Neg Hx   ? ? ?Reviw of Systems:  ?Reviewed in the HPI.  All other systems are negative. ? ?Physical Exam: ?Blood pressure 124/68, pulse 70, height '5\' 6"'$  (1.676 m), weight 146 lb 6.4 oz (66.4 kg), SpO2 97 %. ? ?GEN:  Well nourished, well developed in no acute distress ?HEENT: Normal ?NECK: No JVD; No carotid bruits ?LYMPHATICS: No lymphadenopathy ?CARDIAC: RRR soft mid systolic murmur below L breast . Unchanged from previous exam  ?RESPIRATORY:  Clear to auscultation without rales, wheezing or rhonchi  ?ABDOMEN: Soft, non-tender, non-distended ?MUSCULOSKELETAL:  No edema; No deformity  ?SKIN: Warm and dry ?NEUROLOGIC:  Alert and oriented x 3 ? ? ? ?ECG:   December 09, 2021: Normal sinus rhythm at 70.  No ST or T wave changes. ? ? ?Assessment / Plan:  ? ? ?1. Hypertension -blood pressures well controlled.  She only takes the HCTZ see very rarely.  We will change her HCTZ and K-Lor to as needed.  Continue diltiazem. ? ? ?2. Mitral valve prolapse.   Lenord Fellers is  doing well.  Her mitral valve prolapse murmur sounds unchanged.  We will continue to follow. ? ? ?3. Hyperlipidemia-is of been well controlled.  We will check lipids, basic metabolic profile, ALT today. ? ? ?  ?4.  Ray

## 2021-12-31 ENCOUNTER — Other Ambulatory Visit: Payer: Self-pay | Admitting: Cardiovascular Disease

## 2022-01-06 ENCOUNTER — Other Ambulatory Visit: Payer: Self-pay | Admitting: Cardiovascular Disease

## 2022-05-27 ENCOUNTER — Ambulatory Visit: Payer: BC Managed Care – PPO

## 2022-10-14 ENCOUNTER — Other Ambulatory Visit: Payer: Self-pay | Admitting: Family Medicine

## 2022-10-14 ENCOUNTER — Ambulatory Visit (INDEPENDENT_AMBULATORY_CARE_PROVIDER_SITE_OTHER): Payer: 59 | Admitting: Family Medicine

## 2022-10-14 ENCOUNTER — Encounter: Payer: Self-pay | Admitting: Family Medicine

## 2022-10-14 VITALS — BP 142/88 | HR 70 | Temp 98.3°F | Ht 65.5 in | Wt 146.2 lb

## 2022-10-14 DIAGNOSIS — F418 Other specified anxiety disorders: Secondary | ICD-10-CM

## 2022-10-14 DIAGNOSIS — E871 Hypo-osmolality and hyponatremia: Secondary | ICD-10-CM

## 2022-10-14 DIAGNOSIS — K5909 Other constipation: Secondary | ICD-10-CM

## 2022-10-14 DIAGNOSIS — Z23 Encounter for immunization: Secondary | ICD-10-CM | POA: Diagnosis not present

## 2022-10-14 DIAGNOSIS — E782 Mixed hyperlipidemia: Secondary | ICD-10-CM | POA: Diagnosis not present

## 2022-10-14 DIAGNOSIS — Z0000 Encounter for general adult medical examination without abnormal findings: Secondary | ICD-10-CM

## 2022-10-14 DIAGNOSIS — Z0001 Encounter for general adult medical examination with abnormal findings: Secondary | ICD-10-CM | POA: Diagnosis not present

## 2022-10-14 LAB — COMPREHENSIVE METABOLIC PANEL
ALT: 18 U/L (ref 0–35)
AST: 19 U/L (ref 0–37)
Albumin: 4.6 g/dL (ref 3.5–5.2)
Alkaline Phosphatase: 40 U/L (ref 39–117)
BUN: 11 mg/dL (ref 6–23)
CO2: 27 mEq/L (ref 19–32)
Calcium: 9.5 mg/dL (ref 8.4–10.5)
Chloride: 96 mEq/L (ref 96–112)
Creatinine, Ser: 0.76 mg/dL (ref 0.40–1.20)
GFR: 83.87 mL/min (ref >60.00)
Glucose, Bld: 100 mg/dL — ABNORMAL HIGH (ref 70–99)
Potassium: 3.9 mEq/L (ref 3.5–5.1)
Sodium: 132 mEq/L — ABNORMAL LOW (ref 135–145)
Total Bilirubin: 0.6 mg/dL (ref 0.2–1.2)
Total Protein: 7.3 g/dL (ref 6.0–8.3)

## 2022-10-14 LAB — LIPID PANEL
Cholesterol: 189 mg/dL (ref 0–200)
HDL: 89.1 mg/dL (ref >39.00)
LDL Cholesterol: 89 mg/dL (ref 0–99)
NonHDL: 99.89
Total CHOL/HDL Ratio: 2
Triglycerides: 56 mg/dL (ref 0.0–149.0)
VLDL: 11.2 mg/dL (ref 0.0–40.0)

## 2022-10-14 LAB — CBC WITH DIFFERENTIAL/PLATELET
Basophils Absolute: 0 10*3/uL (ref 0.0–0.1)
Basophils Relative: 0.5 % (ref 0.0–3.0)
Eosinophils Absolute: 0.2 10*3/uL (ref 0.0–0.7)
Eosinophils Relative: 2.8 % (ref 0.0–5.0)
HCT: 38.7 % (ref 36.0–46.0)
Hemoglobin: 13 g/dL (ref 12.0–15.0)
Lymphocytes Relative: 28 % (ref 12.0–46.0)
Lymphs Abs: 1.7 10*3/uL (ref 0.7–4.0)
MCHC: 33.6 g/dL (ref 30.0–36.0)
MCV: 98.2 fl (ref 78.0–100.0)
Monocytes Absolute: 0.4 10*3/uL (ref 0.1–1.0)
Monocytes Relative: 6.1 % (ref 3.0–12.0)
Neutro Abs: 3.7 10*3/uL (ref 1.4–7.7)
Neutrophils Relative %: 62.6 % (ref 43.0–77.0)
Platelets: 262 10*3/uL (ref 150.0–400.0)
RBC: 3.94 Mil/uL (ref 3.87–5.11)
RDW: 12.3 % (ref 11.5–15.5)
WBC: 5.9 10*3/uL (ref 4.0–10.5)

## 2022-10-14 LAB — HEMOGLOBIN A1C: Hgb A1c MFr Bld: 5.9 % (ref 4.6–6.5)

## 2022-10-14 LAB — TSH: TSH: 1.4 u[IU]/mL (ref 0.35–5.50)

## 2022-10-14 MED ORDER — PROPRANOLOL HCL 10 MG PO TABS: ORAL_TABLET | ORAL | 2 refills | Status: AC

## 2022-10-14 MED ORDER — LINACLOTIDE 145 MCG PO CAPS: 145.0000 ug | ORAL_CAPSULE | Freq: Every day | ORAL | 3 refills | Status: DC

## 2022-10-16 ENCOUNTER — Telehealth: Payer: Self-pay | Admitting: Internal Medicine

## 2022-10-16 ENCOUNTER — Telehealth: Payer: 59

## 2022-10-16 NOTE — Telephone Encounter (Signed)
Contacted by [facility] nurse Patient is ill--tested positive for COVID this morning Fever and other systemic symptoms (chills, etc) Oximetry variable---but in the 80's at times---with some SOB  Advised that she seek in person evaluation this morning at an urgent care (preferably one with x-ray). I am not comfortable with treating her without a visit given the hypoxia

## 2022-10-17 ENCOUNTER — Ambulatory Visit (HOSPITAL_COMMUNITY): Payer: Self-pay

## 2022-10-22 NOTE — Progress Notes (Signed)
Established Patient Office Visit   Subjective  Patient ID: Katrina [name], female    DOB: [birth year]  Age: 62 y.o. MRN: [id number]  Chief Complaint  Patient presents with   Annual Exam    Patient is a 62 year old female with pmh sig for HTN, HLD, asthma who is seen for CPE.  Patient states she has been doing well overall, staying busy with work.  Notes increased anxiety prior to [facility] presentations.  Endorses continued chronic constipation issues.  OTC meds including increasing fiber are not helpful.  Did not take BP this morning is fasting.  Denies headaches, changes in vision, CP, LE edema.  Has appointment scheduled [facility].      ROS Negative unless stated above    Objective:     BP (!) 142/88 (BP Location: Right Arm, Cuff Size: Normal)   Pulse 70   Temp 98.3 F (36.8 C) (Oral)   Ht 5' 5.5" (1.664 m)   Wt 146 lb 3.2 oz (66.3 kg)   SpO2 98%   BMI 23.96 kg/m    Physical Exam Constitutional:      Appearance: Normal appearance.  HENT:     Head: Normocephalic and atraumatic.     Right Ear: Tympanic membrane, ear canal and external ear normal.     Left Ear: Tympanic membrane, ear canal and external ear normal.     Nose: Nose normal.     Mouth/Throat:     Mouth: Mucous membranes are moist.     Pharynx: No oropharyngeal exudate or posterior oropharyngeal erythema.  Eyes:     General: No scleral icterus.    Extraocular Movements: Extraocular movements intact.     Conjunctiva/sclera: Conjunctivae normal.     Pupils: Pupils are equal, round, and reactive to light.  Neck:     Thyroid: No thyromegaly.  Cardiovascular:     Rate and Rhythm: Normal rate and regular rhythm.     Pulses: Normal pulses.     Heart sounds: Normal heart sounds. No murmur heard.    No friction rub.  Pulmonary:     Effort: Pulmonary effort is normal.     Breath sounds: Normal breath sounds. No wheezing, rhonchi or rales.   Abdominal:     General: Bowel sounds are normal.     Palpations: Abdomen is soft.     Tenderness: There is no abdominal tenderness.  Musculoskeletal:        General: No deformity. Normal range of motion.  Lymphadenopathy:     Cervical: No cervical adenopathy.  Skin:    General: Skin is warm and dry.     Findings: No lesion.  Neurological:     General: No focal deficit present.     Mental Status: She is alert and oriented to person, place, and time.  Psychiatric:        Mood and Affect: Mood normal.        Thought Content: Thought content normal.      Results for orders placed or performed in visit on 10/14/22  CBC with Differential/Platelet  Result Value Ref Range   WBC 5.9 4.0 - 10.5 K/uL   RBC 3.94 3.87 - 5.11 Mil/uL   Hemoglobin 13.0 12.0 - 15.0 g/dL   HCT 38.7 36.0 - 46.0 %   MCV 98.2 78.0 - 100.0 fl   MCHC 33.6 30.0 - 36.0 g/dL   RDW 12.3 11.5 - 15.5 %   Platelets 262.0 150.0 - 400.0 K/uL  Neutrophils Relative % 62.6 43.0 - 77.0 %   Lymphocytes Relative 28.0 12.0 - 46.0 %   Monocytes Relative 6.1 3.0 - 12.0 %   Eosinophils Relative 2.8 0.0 - 5.0 %   Basophils Relative 0.5 0.0 - 3.0 %   Neutro Abs 3.7 1.4 - 7.7 K/uL   Lymphs Abs 1.7 0.7 - 4.0 K/uL   Monocytes Absolute 0.4 0.1 - 1.0 K/uL   Eosinophils Absolute 0.2 0.0 - 0.7 K/uL   Basophils Absolute 0.0 0.0 - 0.1 K/uL  Comprehensive metabolic panel  Result Value Ref Range   Sodium 132 (L) 135 - 145 mEq/L   Potassium 3.9 3.5 - 5.1 mEq/L   Chloride 96 96 - 112 mEq/L   CO2 27 19 - 32 mEq/L   Glucose, Bld 100 (H) 70 - 99 mg/dL   BUN 11 6 - 23 mg/dL   Creatinine, Ser 0.76 0.40 - 1.20 mg/dL   Total Bilirubin 0.6 0.2 - 1.2 mg/dL   Alkaline Phosphatase 40 39 - 117 U/L   AST 19 0 - 37 U/L   ALT 18 0 - 35 U/L   Total Protein 7.3 6.0 - 8.3 g/dL   Albumin 4.6 3.5 - 5.2 g/dL   GFR 83.87 >60.00 mL/min   Calcium 9.5 8.4 - 10.5 mg/dL  Lipid panel  Result Value Ref Range   Cholesterol 189 0 - 200 mg/dL    Triglycerides 56.0 0.0 - 149.0 mg/dL   HDL 89.10 >39.00 mg/dL   VLDL 11.2 0.0 - 40.0 mg/dL   LDL Cholesterol 89 0 - 99 mg/dL   Total CHOL/HDL Ratio 2    NonHDL 99.89   TSH  Result Value Ref Range   TSH 1.40 0.35 - 5.50 uIU/mL  Hemoglobin A1c  Result Value Ref Range   Hgb A1c MFr Bld 5.9 4.6 - 6.5 %      Assessment & Plan:  Well adult exam -Anticipatory guidance given including wearing seatbelts, smoke detectors in the home, increasing physical activity, increasing p.o. intake of water and vegetables. -Labs -[facility]amm[facility] and [facility] with [facility]. Colonoscopy done 05/20/2020 -Immunizations reviewed.  Shingles vaccine given this visit.  Other immunizations including COVID, RSV, and flu updated. -Given handout -Next CPE in 1 year -     Hemoglobin A1c  Chronic constipation -Advised symptoms likely 2/2 IBS -Continue lifestyle modifications -Low FODMAP diet -Start Linzess -For continued or worsening symptoms follow-up with GI -     CBC with Differential/Platelet -     Comprehensive metabolic panel -     linaCLOtide; Take 1 capsule (145 mcg total) by mouth daily before breakfast.  Dispense: 30 capsule; Refill: 3  Performance anxiety -     CBC with Differential/Platelet -     TSH -     Propranolol HCl; Take one tab 1 hour prior to event.  Dispense: 60 tablet; Refill: 2  Mixed hyperlipidemia -Continue Lipitor 20 mg daily -Continue lifestyle modifications -     Lipid panel  Need for shingles vaccine -     Varicella-zoster vaccine IM    Return in about 1 month (around 11/12/2022).   Billie Ruddy, MD

## 2022-10-31 ENCOUNTER — Other Ambulatory Visit: Payer: 59

## 2022-11-03 ENCOUNTER — Other Ambulatory Visit (INDEPENDENT_AMBULATORY_CARE_PROVIDER_SITE_OTHER): Payer: 59

## 2022-11-03 DIAGNOSIS — E871 Hypo-osmolality and hyponatremia: Secondary | ICD-10-CM | POA: Diagnosis not present

## 2022-11-03 LAB — BASIC METABOLIC PANEL
BUN: 12 mg/dL (ref 6–23)
CO2: 27 mEq/L (ref 19–32)
Calcium: 9.5 mg/dL (ref 8.4–10.5)
Chloride: 103 mEq/L (ref 96–112)
Creatinine, Ser: 0.81 mg/dL (ref 0.40–1.20)
GFR: 77.67 mL/min (ref >60.00)
Glucose, Bld: 74 mg/dL (ref 70–99)
Potassium: 3.9 mEq/L (ref 3.5–5.1)
Sodium: 138 mEq/L (ref 135–145)

## 2022-12-07 ENCOUNTER — Encounter: Payer: Self-pay | Admitting: Cardiovascular Disease

## 2022-12-07 NOTE — Progress Notes (Signed)
Katrina [name] Date of Birth  February 09, 1961 [facility]     [place]  [address]    [address]   [address] [place], [place]  [address]    [place], [place]  [address] [phone]  Fax  [phone]  [phone]  Fax [phone]  Problem List: 1. Hypetension 2. Mitral valve prolapse.  3. Hyperlipidemia   Previous Notes   Katrina [name] is a 62 yo who presents today for evaluation of HTN.  She was noted to have elevation of her diastolic blood pressure.  She's also been having some dyspnea with exertion. She went hiking at the [facility] [name] recently and had significant shortness breath climbing up the trail.  She feels like her metabolism has slowed recently.  She walks her dogs 2 miles a day without problems.   She has been working out and walking regularly.    November 09, 2012:  Katrina [name] was tried on Losartan - she was feeling poorly and her primary recommended that she stop.  She stopped the Losartan and is feeling better.   December 01, 2015:  Katrina [name] is seen back today after 2 year absence. Doing ok Had some pleurisy last month.  BP was a little elevated with that .  Has had some heart racing when traveling [place].   Walk every day.     Feb. 2, 2018: Doing well. No complaints  Not walking as much  Went to [place] this past summer   BP has been elevated.   Oct. 1, 2018:  Katrina [name] is seen today  Doing well. Walking regularly,  No CP or dyspnea  Has some allergies  LDL was 106 last week - takes Atorvastatin 10 mg a day   October 08, 2018:  Katrina [name] was seen in the emergency room on January 20 with an episode of presyncope.  It was thought to be most likely due to orthostatic hypotension.  Seem to be secondary to her HCTZ therapy.  We decreased the hydrochlorothiazide to 12.5 mg a day.  She is doing better at hydrating Had an occular migraine HR on Jan. 23. No further episodes of syncope or near syncope  Is on amlodipine - partly due to  Raynauds.  Will change it to Dilt. CD 120 today   Jan. 5, 2021  Katrina [name] is seen today . Staying active.   Work is busy .  No CP or dyspnea.    Jan. 3, 2022: Katrina [name] is seen today for follow up of her MVP, HTN, HLD  Has Raynaud syndrome - on amlodipine Feels well  Is gaining some weight , trying to exercise ,  Working on diet .  Wt is 144 lbs.   December 09, 2021   Katrina [name] is seen for follow up of her MVP, HTN, HLD  Has Raynaulds syndrome  - is on diltiazem    Her Raynaulds has been fairly well controlled  Has had some unexplained weight gain and fatiuge  Will check TSH  ( brother had thyroid cancer )   December 08, 2022 Katrina [name] is seen today for follow up of her MVP, HLD, HTN Has Raynaulds syndrome   Working more , not less  Not as much exercise as she would like  Walks  Very minimal DOE with climbing stairs .  No CP  Some fatigue     Current Outpatient Medications on File Prior to Visit  Medication Sig Dispense Refill   albuterol (PROAIR HFA) 108 (90 Base) MCG/ACT inhaler Inhale  2 puffs into the lungs every 6 (six) hours as needed for wheezing or shortness of breath. 1 each 0   atorvastatin (LIPITOR) 20 MG tablet Take 1 tablet (20 mg total) by mouth daily. 90 tablet 3   diltiazem (CARDIZEM CD) 120 MG 24 hr capsule Take 1 capsule (120 mg total) by mouth daily. 90 capsule 3   lisinopril (ZESTRIL) 10 MG tablet Take 1 tablet (10 mg total) by mouth daily. 90 tablet 3   loratadine (CLARITIN) 10 MG tablet Take 10 mg by mouth daily.     polyethylene glycol powder (GLYCOLAX/MIRALAX) 17 GM/SCOOP powder Take 1 Container by mouth as needed.     Probiotic Product (PROBIOTIC DAILY PO) Take by mouth. Align Probiotic     B Complex Vitamins (VITAMIN-B COMPLEX PO) Take by mouth. (Patient not taking: Reported on 12/09/2021)     hydrochlorothiazide (MICROZIDE) 12.5 MG capsule Take 1 capsule (12.5 mg total) by mouth daily as needed. (Patient not taking: Reported on 10/14/2022)     linaclotide (LINZESS)  145 MCG CAPS capsule Take 1 capsule (145 mcg total) by mouth daily before breakfast. (Patient not taking: Reported on 12/08/2022) 30 capsule 3   meloxicam (MOBIC) 15 MG tablet Take 15 mg by mouth daily. (Patient not taking: Reported on 12/09/2021)     potassium chloride (KLOR-CON) 10 MEQ tablet Take 2 tablets (20 mEq total) by mouth 2 (two) times daily. (Patient not taking: Reported on 10/14/2022) 120 tablet 1   propranolol (INDERAL) 10 MG tablet Take one tab 1 hour prior to event. (Patient not taking: Reported on 12/08/2022) 60 tablet 2   No current facility-administered medications on file prior to visit.    Allergies  Allergen Reactions   Cefuroxime Diarrhea and Nausea And Vomiting    Caused C Dif   Ciprofloxacin     Past Medical History:  Diagnosis Date   Allergy    Anal fissure    Arthritis    not dx'd  but in hands    C. difficile diarrhea    04/2013   Constipation    chronic her entire life   Diverticulosis    Family history of breast cancer in first degree relative    Frozen shoulder    history of bilateral frozen shoulder   GERD (gastroesophageal reflux disease)    past hx- many years ago    Headache(784.0)    History of abnormal Pap smear    S/P lazer conization   History of stress test    Stress echocardiogram 7/19: Normal clinical, ECG and echo response to stress.  Note very frequent monomorphic PVCs especially in early recovery.   Hyperlipidemia    controlled    Hypertension    controlled    Lupus anticoagulant disorder    refuted by hematologist in 2014   Menopause    Mitral regurgitation    Echo 6/19:  EF 60-65, no RWMA, Gr 1 DD, mild post leaflet MVP, mod MR, normal RVSF, mild TR // Echocardiogram 06/2019: EF 60-65, myxomatous mitral valve with mild bileaflet prolapse and mod MR, mild AoV sclerosis (no AS), RVSP 30    Mitral valve prolapse    a. echo 01/2009 showed EF 60-65%, systolic bowing of mitral valve without prolapse.    Near syncope 09/17/2018    Orthostatic hypotension 09/17/2018   Osteopenia    Raynaud disease    a. question false positive antiphospholipid panel-> sx later felt due to Raynaud itself (transient finger discoloration) rather than embolic phenomena.  Vitamin D deficiency     Past Surgical History:  Procedure Laterality Date   COLONOSCOPY     FOOT SURGERY  2007   left   KNEE SURGERY Bilateral 2006   for torn meniscus    WISDOM TOOTH EXTRACTION  1981    Social History   Tobacco Use  Smoking Status Former   Packs/day: 1.00   Years: 6.00   Additional pack years: 0.00   Total pack years: 6.00   Types: Cigarettes   Quit date: 07/02/1991   Years since quitting: 31.4  Smokeless Tobacco Never    Social History   Substance and Sexual Activity  Alcohol Use Yes   Comment: 1-2 glasses of wine occ     Family History  Problem Relation Age of Onset   Colon cancer Mother 66   Breast cancer Mother    Hypertension Mother    Anemia Father    Heart disease Father        CHF   Lung disease Father    Atrial fibrillation Father    COPD Father    Breast cancer Maternal Aunt    Cancer Paternal Aunt        brain   Heart disease Maternal Grandmother    Cancer Maternal Grandfather        throat   Esophageal cancer Maternal Grandfather    Stroke Sister    Lymphoma Brother    Irritable bowel syndrome Daughter    Other Daughter        Primary Sclerosing Cholangitis   Heart attack Neg Hx    Colon polyps Neg Hx    Rectal cancer Neg Hx    Stomach cancer Neg Hx     Reviw of Systems:  Reviewed in the HPI.  All other systems are negative.  Physical Exam: Blood pressure 126/84, pulse 70, height 5\' 6"  (1.676 m), weight 143 lb 3.2 oz (65 kg), SpO2 96 %.       GEN:  Well nourished, well developed in no acute distress HEENT: Normal NECK: No JVD; No carotid bruits LYMPHATICS: No lymphadenopathy CARDIAC: RRR  localized late systoic click with MR murmur ( just under and lateral to left breast ) RESPIRATORY:   Clear to auscultation without rales, wheezing or rhonchi  ABDOMEN: Soft, non-tender, non-distended MUSCULOSKELETAL:  No edema; No deformity  SKIN: Warm and dry NEUROLOGIC:  Alert and oriented x 3     ECG:   December 08, 2022: Normal sinus rhythm at 70.  Assessment / Plan:    1. Hypertension -   2. Mitral valve prolapse.   She has mitral valve prolapse murmur.  Her MR murmur is like last to a small area below her left breast and just laterally.  Her last echocardiogram was 4 years ago.  Will repeat her echocardiogram.  I will see her again in 1 year.   3. Hyperlipidemia-  stable       4.  Raynaud's disease: He she is not having any current symptoms.  She is careful about keeping her hands warm.  Continue diltiazem.  5.  She is having some fatigue and has had some weight gain.  We will check a TSH.  In addition, she has a family history of thyroid cancer.  Kristeen Miss, MD  12/08/2022 4:15 PM    [facility] [address] [place],  [address] [place], [place]  [address] Pager [phone] Phone: [phone]; Fax: [phone]

## 2022-12-08 ENCOUNTER — Ambulatory Visit: Payer: 59 | Attending: Cardiovascular Disease | Admitting: Cardiovascular Disease

## 2022-12-08 ENCOUNTER — Encounter: Payer: Self-pay | Admitting: Cardiovascular Disease

## 2022-12-08 VITALS — BP 126/84 | HR 70 | Ht 66.0 in | Wt 143.2 lb

## 2022-12-08 DIAGNOSIS — I341 Nonrheumatic mitral (valve) prolapse: Secondary | ICD-10-CM

## 2022-12-08 DIAGNOSIS — I340 Nonrheumatic mitral (valve) insufficiency: Secondary | ICD-10-CM

## 2022-12-08 NOTE — Patient Instructions (Signed)
Medication Instructions:  Your physician recommends that you continue on your current medications as directed. Please refer to the Current Medication list given to you today. *If you need a refill on your cardiac medications before your next appointment, please call your pharmacy*   Testing/Procedures: Echocardiogram Your physician has requested that you have an echocardiogram. Echocardiography is a painless test that uses sound waves to create images of your heart. It provides your doctor with information about the size and shape of your heart and how well your heart's chambers and valves are working. This procedure takes approximately one hour. There are no restrictions for this procedure. Please do NOT wear cologne, perfume, aftershave, or lotions (deodorant is allowed). Please arrive 15 minutes prior to your appointment time.    Follow-Up: At [facility], you and your health needs are our priority.  As part of our continuing mission to provide you with exceptional heart care, we have created designated Provider Care Teams.  These Care Teams include your primary Cardiologist (physician) and Advanced Practice Providers (APPs -  Physician Assistants and Nurse Practitioners) who all work together to provide you with the care you need, when you need it.  We recommend signing up for the patient portal called "MyChart".  Sign up information is provided on this After Visit Summary.  MyChart is used to connect with patients for Virtual Visits (Telemedicine).  Patients are able to view lab/test results, encounter notes, upcoming appointments, etc.  Non-urgent messages can be sent to your provider as well.   To learn more about what you can do with MyChart, go to [email or web address].    Your next appointment:   1 year(s)  Provider:   Philip Nahser, MD   

## 2023-01-03 ENCOUNTER — Ambulatory Visit (HOSPITAL_COMMUNITY): Payer: 59 | Attending: Cardiology

## 2023-01-03 DIAGNOSIS — I340 Nonrheumatic mitral (valve) insufficiency: Secondary | ICD-10-CM | POA: Diagnosis present

## 2023-01-03 DIAGNOSIS — I341 Nonrheumatic mitral (valve) prolapse: Secondary | ICD-10-CM

## 2023-01-05 LAB — ECHOCARDIOGRAM COMPLETE
Area-P 1/2: 3.62 cm2
S' Lateral: 3.1 cm

## 2023-01-09 ENCOUNTER — Other Ambulatory Visit: Payer: Self-pay | Admitting: Cardiovascular Disease

## 2023-01-16 ENCOUNTER — Other Ambulatory Visit: Payer: Self-pay | Admitting: Cardiovascular Disease

## 2023-01-31 ENCOUNTER — Ambulatory Visit (INDEPENDENT_AMBULATORY_CARE_PROVIDER_SITE_OTHER): Payer: 59

## 2023-01-31 DIAGNOSIS — Z23 Encounter for immunization: Secondary | ICD-10-CM

## 2023-01-31 NOTE — Addendum Note (Signed)
Addended by: Weyman Croon E on: 01/31/2023 09:30 AM   Modules accepted: Orders

## 2023-03-27 ENCOUNTER — Ambulatory Visit: Payer: 59 | Admitting: Family Medicine

## 2023-03-29 ENCOUNTER — Ambulatory Visit (INDEPENDENT_AMBULATORY_CARE_PROVIDER_SITE_OTHER): Payer: 59 | Admitting: Family Medicine

## 2023-03-29 ENCOUNTER — Encounter: Payer: Self-pay | Admitting: Family Medicine

## 2023-03-29 VITALS — BP 118/70 | Ht 66.0 in | Wt 143.0 lb

## 2023-03-29 DIAGNOSIS — M810 Age-related osteoporosis without current pathological fracture: Secondary | ICD-10-CM

## 2023-03-29 MED ORDER — TYMLOS 3120 MCG/1.56ML ~~LOC~~ SOPN: 80.0000 ug | PEN_INJECTOR | Freq: Every day | SUBCUTANEOUS | 24 refills | Status: DC

## 2023-03-29 MED ORDER — ASSURE ID DUO PRO PEN NEEDLES 31G X 5 MM MISC: 1.0000 | Freq: Every day | 24 refills | Status: DC

## 2023-03-29 NOTE — Patient Instructions (Signed)
We will look into the benefits for Tymlos for you (this is the one that would be a shot daily for 18-24 months). Calcium 1200mg  daily, Vitamin D 800 international units daily. Continue your alendronate in the meantime as you have been - full glass of water on an empty stomach, make sure you're upright for an hour after taking.

## 2023-03-30 ENCOUNTER — Encounter: Payer: Self-pay | Admitting: Family Medicine

## 2023-03-30 NOTE — Progress Notes (Signed)
PCP: Deeann Saint, MD  Subjective:   HPI: Patient is a 62 y.o. female here for osteoporosis.  Patient here for evaluation of osteoporosis.  She recently had a bone density test ordered by Dr. Rana Snare which showed a T score of -4.2 of the spine, -3.2 left femoral neck and -3.1 right femoral neck (01/05/23). Prior treatment: alendronate started recently History of Hip, Spine, or Wrist Fracture: no Heart disease or stroke: no - history of mitral valve prolapse Cancer: no Kidney Disease: no Gastric/Peptic Ulcer: no Gastric bypass surgery: no Severe GERD: no History of seizures: no Age at Menopause: 79 Calcium intake: loves dairy, a lot in diet but doesn't track Vitamin D intake: 1000 international units daily Hormone replacement therapy: no Smoking history: former - 10 pack years Alcohol: 2/day Exercise: walks 1 mile daily, weight training 2 times a week Major dental work in past year: no Parents with hip/spine fracture: no   Past Medical History:  Diagnosis Date   Allergy    Anal fissure    Arthritis    not dx'd  but in hands    C. difficile diarrhea    04/2013   Constipation    chronic her entire life   Diverticulosis    Family history of breast cancer in first degree relative    Frozen shoulder    history of bilateral frozen shoulder   GERD (gastroesophageal reflux disease)    past hx- many years ago    Headache(784.0)    History of abnormal Pap smear    S/P lazer conization   History of stress test    Stress echocardiogram 7/19: Normal clinical, ECG and echo response to stress.  Note very frequent monomorphic PVCs especially in early recovery.   Hyperlipidemia    controlled    Hypertension    controlled    Lupus anticoagulant disorder (HCC)    refuted by hematologist in 2014   Menopause    Mitral regurgitation    Echo 6/19:  EF 60-65, no RWMA, Gr 1 DD, mild post leaflet MVP, mod MR, normal RVSF, mild TR // Echocardiogram 06/2019: EF 60-65, myxomatous mitral  valve with mild bileaflet prolapse and mod MR, mild AoV sclerosis (no AS), RVSP 30    Mitral valve prolapse    a. echo 01/2009 showed EF 60-65%, systolic bowing of mitral valve without prolapse.    Near syncope 09/17/2018   Orthostatic hypotension 09/17/2018   Osteopenia    Raynaud disease    a. question false positive antiphospholipid panel-> sx later felt due to Raynaud itself (transient finger discoloration) rather than embolic phenomena.   Vitamin D deficiency     Current Outpatient Medications on File Prior to Visit  Medication Sig Dispense Refill   albuterol (PROAIR HFA) 108 (90 Base) MCG/ACT inhaler Inhale 2 puffs into the lungs every 6 (six) hours as needed for wheezing or shortness of breath. 1 each 0   atorvastatin (LIPITOR) 20 MG tablet Take 1 tablet (20 mg total) by mouth daily. 90 tablet 3   B Complex Vitamins (VITAMIN-B COMPLEX PO) Take by mouth. (Patient not taking: Reported on 12/09/2021)     diltiazem (CARDIZEM CD) 120 MG 24 hr capsule Take 1 capsule (120 mg total) by mouth daily. 90 capsule 3   hydrochlorothiazide (MICROZIDE) 12.5 MG capsule Take 1 capsule (12.5 mg total) by mouth daily as needed. (Patient not taking: Reported on 10/14/2022)     linaclotide (LINZESS) 145 MCG CAPS capsule Take 1 capsule (145 mcg total) by  mouth daily before breakfast. (Patient not taking: Reported on 12/08/2022) 30 capsule 3   lisinopril (ZESTRIL) 10 MG tablet Take 1 tablet (10 mg total) by mouth daily. 90 tablet 3   loratadine (CLARITIN) 10 MG tablet Take 10 mg by mouth daily.     meloxicam (MOBIC) 15 MG tablet Take 15 mg by mouth daily. (Patient not taking: Reported on 12/09/2021)     polyethylene glycol powder (GLYCOLAX/MIRALAX) 17 GM/SCOOP powder Take 1 Container by mouth as needed.     potassium chloride (KLOR-CON) 10 MEQ tablet Take 2 tablets (20 mEq total) by mouth 2 (two) times daily. (Patient not taking: Reported on 10/14/2022) 120 tablet 1   Probiotic Product (PROBIOTIC DAILY PO) Take by  mouth. Align Probiotic     propranolol (INDERAL) 10 MG tablet Take one tab 1 hour prior to event. (Patient not taking: Reported on 12/08/2022) 60 tablet 2   No current facility-administered medications on file prior to visit.    Past Surgical History:  Procedure Laterality Date   COLONOSCOPY     FOOT SURGERY  2007   left   KNEE SURGERY Bilateral 2006   for torn meniscus    WISDOM TOOTH EXTRACTION  1981    Allergies  Allergen Reactions   Cefuroxime Diarrhea and Nausea And Vomiting    Caused C Dif   Ciprofloxacin     BP 118/70 (BP Location: Left Arm, Patient Position: Sitting)   Ht 5\' 6"  (1.676 m)   Wt 143 lb (64.9 kg)   BMI 23.08 kg/m       No data to display              No data to display              Objective:  Physical Exam:  Gen: NAD, comfortable in exam room  Dexa 01/05/23 T-scores: Spine -4.2, L fem neck -3.2, R fem neck -3.1   Assessment & Plan:  1. Osteoporosis - patient very high risk given her T scores all being lower than -3, especially spine where it is -4.2.  She would benefit from anabolic medication - after discussion of evenity and tymlos she would like to go with tymlos - will look into benefits for this.  Calcium, vitamin D supplementation, continue with exercise.   Total visit time 30 minutes including documentation.

## 2023-03-31 ENCOUNTER — Encounter: Payer: Self-pay | Admitting: *Deleted

## 2023-03-31 NOTE — Addendum Note (Signed)
Addended by: Annita Brod on: 03/31/2023 08:56 AM   Modules accepted: Orders

## 2023-04-05 ENCOUNTER — Encounter: Payer: Self-pay | Admitting: *Deleted

## 2023-04-07 ENCOUNTER — Other Ambulatory Visit (HOSPITAL_COMMUNITY): Payer: Self-pay

## 2023-04-07 ENCOUNTER — Other Ambulatory Visit: Payer: Self-pay | Admitting: *Deleted

## 2023-04-07 ENCOUNTER — Other Ambulatory Visit: Payer: Self-pay

## 2023-04-07 MED ORDER — TYMLOS 3120 MCG/1.56ML ~~LOC~~ SOPN
80.0000 ug | PEN_INJECTOR | Freq: Every day | SUBCUTANEOUS | 12 refills | Status: DC
  Filled 2023-04-07: qty 1.56, fill #0
  Filled 2023-04-10 (×2): qty 1.56, 28d supply, fill #0

## 2023-04-07 MED ORDER — ASSURE ID DUO PRO PEN NEEDLES 31G X 5 MM MISC
1.0000 | Freq: Every day | 24 refills | Status: DC
  Filled 2023-04-07: qty 30, fill #0

## 2023-04-07 MED ORDER — TYMLOS 3120 MCG/1.56ML ~~LOC~~ SOPN
80.0000 ug | PEN_INJECTOR | Freq: Every day | SUBCUTANEOUS | 24 refills | Status: DC
  Filled 2023-04-07: qty 46.8, fill #0

## 2023-04-10 ENCOUNTER — Other Ambulatory Visit: Payer: Self-pay

## 2023-04-10 ENCOUNTER — Other Ambulatory Visit: Payer: Self-pay | Admitting: *Deleted

## 2023-04-10 MED ORDER — TYMLOS 3120 MCG/1.56ML ~~LOC~~ SOPN: 80.0000 ug | PEN_INJECTOR | Freq: Every day | SUBCUTANEOUS | 12 refills | Status: DC

## 2023-04-10 MED ORDER — ASSURE ID DUO PRO PEN NEEDLES 31G X 5 MM MISC: 1.0000 | Freq: Every day | 24 refills | Status: DC

## 2023-04-11 ENCOUNTER — Other Ambulatory Visit: Payer: Self-pay | Admitting: *Deleted

## 2023-04-11 MED ORDER — ASSURE ID DUO PRO PEN NEEDLES 31G X 5 MM MISC: 1.0000 | Freq: Every day | 24 refills | Status: DC

## 2023-04-11 MED ORDER — TYMLOS 3120 MCG/1.56ML ~~LOC~~ SOPN: 80.0000 ug | PEN_INJECTOR | Freq: Every day | SUBCUTANEOUS | 12 refills | Status: DC

## 2023-04-17 ENCOUNTER — Telehealth: Payer: Self-pay | Admitting: *Deleted

## 2023-04-17 NOTE — Telephone Encounter (Signed)
I had an email conversion with the patient and she states she has spoken to many people at [facility] and Tymlos's copay assistance and patient assistance and she is not willing to pay $1,732/mo for this med so she will continue on Alendronate.  I voiced my understanding due to the high cost of the medication.

## 2023-05-20 ENCOUNTER — Encounter (HOSPITAL_COMMUNITY): Payer: Self-pay

## 2023-07-10 ENCOUNTER — Telehealth: Payer: Self-pay | Admitting: Family Medicine

## 2023-07-10 NOTE — Telephone Encounter (Signed)
Megan with triage nurse called and stated pt has had a cough for over a month, she is coughing up green and yellow mucus and she is experiencing shortness of breath at rest. Triage nurse recommended for pt to go to ED, pt has refused. I let her know I would send high priority msg to care team.

## 2023-07-10 NOTE — Telephone Encounter (Signed)
Agree with patient being seen in office.  If symptoms become worse, proceed to UC or ED for further evaluation.

## 2023-07-10 NOTE — Telephone Encounter (Signed)
Spoke with patient, patient is having shortness of breath and a cough, explained that Dr. Salomon Fick sch is full, and patient refused to go to the ED and or Urgent Care, sch an appt with Dr. Clent Ridges at 3:30pm 07/11/23, was also told if symptoms get worse to go to the ED.

## 2023-07-11 ENCOUNTER — Ambulatory Visit: Payer: 59 | Admitting: Family Medicine

## 2023-07-28 ENCOUNTER — Other Ambulatory Visit: Payer: Self-pay

## 2023-11-28 ENCOUNTER — Encounter: Payer: Self-pay | Admitting: Cardiovascular Disease

## 2023-12-28 NOTE — Progress Notes (Addendum)
 Katrina  [name] Freeze Date of Birth  1960-12-17 [facility]     [place]  [address]    [address]   [address] [place], [place]  [address]    [place], [place]  [address] [phone]  Fax  [phone]  [phone]  Fax [phone]  Problem List: 1. Hypetension 2. Mitral valve prolapse.  3. Hyperlipidemia   Previous Notes   Katrina [name] is a 63 yo who presents today for evaluation of HTN.  She was noted to have elevation of her diastolic blood pressure.  She's also been having some dyspnea with exertion. She went hiking at the [facility] [place] recently and had significant shortness breath climbing up the trail.  She feels like her metabolism has slowed recently.  She walks her dogs 2 miles a day without problems.   She has been working out and walking regularly.    November 09, 2012:  Katrina [name] was tried on Losartan  - she was feeling poorly and her primary recommended that she stop.  She stopped the Losartan  and is feeling better.   December 01, 2015:  Katrina [name] is seen back today after 2 year absence. Doing ok Had some pleurisy last month.  BP was a little elevated with that .  Has had some heart racing when traveling [place].   Walk every day.     Feb. 2, 2018: Doing well. No complaints  Not walking as much  Went to [place] this past summer   BP has been elevated.   Oct. 1, 2018:  Katrina [name] is seen today  Doing well. Walking regularly,  No CP or dyspnea  Has some allergies  LDL was 106 last week - takes Atorvastatin  10 mg a day   October 08, 2018:  Katrina [name] was seen in the emergency room on January 20 with an episode of presyncope.  It was thought to be most likely due to orthostatic hypotension.  Seem to be secondary to her HCTZ therapy.  We decreased the hydrochlorothiazide  to 12.5 mg a day.  She is doing better at hydrating Had an occular migraine HR on Jan. 23. No further episodes of syncope or near syncope  Is on amlodipine  - partly due to  Raynauds.  Will change it to Dilt. CD 120 today   Jan. 5, 2021  Katrina [name] is seen today . Staying active.   Work is busy .  No CP or dyspnea.    Jan. 3, 2022: Katrina [name] is seen today for follow up of her MVP, HTN, HLD  Has Raynaud syndrome - on amlodipine  Feels well  Is gaining some weight , trying to exercise ,  Working on diet .  Wt is 144 lbs.   December 09, 2021   Katrina [name] is seen for follow up of her MVP, HTN, HLD  Has Raynaulds syndrome  - is on diltiazem     Her Raynaulds has been fairly well controlled  Has had some unexplained weight gain and fatiuge  Will check TSH  ( brother had thyroid  cancer )   December 08, 2022 Katrina [name] is seen today for follow up of her MVP, HLD, HTN Has Raynaulds syndrome   Working more , not less  Not as much exercise as she would like  Walks  Very minimal DOE with climbing stairs .  No CP  Some fatigue   Dec 29, 2023 Katrina [name] is seen for follow up of her MVP, mild MR  Has Raynaulds syndrome  Has had some fatigue  THS, Vit  D were both normal last year   Has been having more shortness of breath   Has been diagnosed with osteoperososis  Her calcium  level was very elevated    Having some episodes of lightheadedness  She will pay attention to hydration , and eating well     Current Outpatient Medications on File Prior to Visit  Medication Sig Dispense Refill   atorvastatin  (LIPITOR) 20 MG tablet Take 1 tablet (20 mg total) by mouth daily. 90 tablet 3   diltiazem  (CARDIZEM  CD) 120 MG 24 hr capsule Take 1 capsule (120 mg total) by mouth daily. 90 capsule 3   lisinopril  (ZESTRIL ) 10 MG tablet Take 1 tablet (10 mg total) by mouth daily. 90 tablet 3   meloxicam (MOBIC) 15 MG tablet Take 15 mg by mouth daily.     polyethylene glycol powder (GLYCOLAX/MIRALAX) 17 GM/SCOOP powder Take 1 Container by mouth as needed.     Probiotic Product (PROBIOTIC DAILY PO) Take by mouth. Align Probiotic     propranolol  (INDERAL ) 10 MG tablet Take one tab 1 hour prior to  event. 60 tablet 2   Abaloparatide  (TYMLOS ) 3120 MCG/1.56ML SOPN Inject 80 mcg into the skin daily. (Patient not taking: Reported on 12/29/2023) 1.56 mL 12   albuterol  (PROAIR  HFA) 108 (90 Base) MCG/ACT inhaler Inhale 2 puffs into the lungs every 6 (six) hours as needed for wheezing or shortness of breath. (Patient not taking: Reported on 12/29/2023) 1 each 0   B Complex Vitamins (VITAMIN-B COMPLEX PO) Take by mouth. (Patient not taking: Reported on 12/29/2023)     hydrochlorothiazide  (MICROZIDE ) 12.5 MG capsule Take 1 capsule (12.5 mg total) by mouth daily as needed. (Patient not taking: Reported on 12/29/2023)     Insulin  Pen Needle (ASSURE ID DUO PRO PEN NEEDLES) 31G X 5 MM MISC 1 each by Does not apply route daily. 30 each 24   linaclotide  (LINZESS ) 145 MCG CAPS capsule Take 1 capsule (145 mcg total) by mouth daily before breakfast. (Patient not taking: Reported on 12/08/2022) 30 capsule 3   loratadine (CLARITIN) 10 MG tablet Take 10 mg by mouth daily. (Patient not taking: Reported on 12/29/2023)     potassium chloride  (KLOR-CON ) 10 MEQ tablet Take 2 tablets (20 mEq total) by mouth 2 (two) times daily. (Patient not taking: Reported on 12/29/2023) 120 tablet 1   No current facility-administered medications on file prior to visit.    Allergies  Allergen Reactions   Cefuroxime Diarrhea and Nausea And Vomiting    Caused C Dif   Ciprofloxacin     Past Medical History:  Diagnosis Date   Allergy    Anal fissure    Arthritis    not dx'd  but in hands    C. difficile diarrhea    04/2013   Constipation    chronic her entire life   Diverticulosis    Family history of breast cancer in first degree relative    Frozen shoulder    history of bilateral frozen shoulder   GERD (gastroesophageal reflux disease)    past hx- many years ago    Headache(784.0)    History of abnormal Pap smear    S/P lazer conization   History of stress test    Stress echocardiogram 7/19: Normal clinical, ECG and echo response  to stress.  Note very frequent monomorphic PVCs especially in early recovery.   Hyperlipidemia    controlled    Hypertension    controlled    Lupus anticoagulant disorder (HCC)  refuted by hematologist in 2014   Menopause    Mitral regurgitation    Echo 6/19:  EF 60-65, no RWMA, Gr 1 DD, mild post leaflet MVP, mod MR, normal RVSF, mild TR // Echocardiogram 06/2019: EF 60-65, myxomatous mitral valve with mild bileaflet prolapse and mod MR, mild AoV sclerosis (no AS), RVSP 30    Mitral valve prolapse    a. echo 01/2009 showed EF 60-65%, systolic bowing of mitral valve without prolapse.    Near syncope 09/17/2018   Orthostatic hypotension 09/17/2018   Osteopenia    Raynaud disease    a. question false positive antiphospholipid panel-> sx later felt due to Raynaud itself (transient finger discoloration) rather than embolic phenomena.   Vitamin D  deficiency     Past Surgical History:  Procedure Laterality Date   COLONOSCOPY     FOOT SURGERY  2007   left   KNEE SURGERY Bilateral 2006   for torn meniscus    WISDOM TOOTH EXTRACTION  1981    Social History   Tobacco Use  Smoking Status Former   Current packs/day: 0.00   Average packs/day: 1 pack/day for 6.0 years (6.0 ttl pk-yrs)   Types: Cigarettes   Start date: 07/01/1985   Quit date: 07/02/1991   Years since quitting: 32.5  Smokeless Tobacco Never    Social History   Substance and Sexual Activity  Alcohol Use Yes   Comment: 1-2 glasses of wine occ     Family History  Problem Relation Age of Onset   Colon cancer Mother 13   Breast cancer Mother    Hypertension Mother    Anemia Father    Heart disease Father        CHF   Lung disease Father    Atrial fibrillation Father    COPD Father    Breast cancer Maternal Aunt    Cancer Paternal Aunt        brain   Heart disease Maternal Grandmother    Cancer Maternal Grandfather        throat   Esophageal cancer Maternal Grandfather    Stroke Sister    Lymphoma Brother     Irritable bowel syndrome Daughter    Other Daughter        Primary Sclerosing Cholangitis   Heart attack Neg Hx    Colon polyps Neg Hx    Rectal cancer Neg Hx    Stomach cancer Neg Hx     Reviw of Systems:  Reviewed in the HPI.  All other systems are negative.   Physical Exam: Blood pressure 128/64, pulse 76, height 5\' 6"  (1.676 m), weight 145 lb 9.6 oz (66 kg), SpO2 93%.       GEN:  Well nourished, well developed in no acute distress HEENT: Normal NECK: No JVD; No carotid bruits LYMPHATICS: No lymphadenopathy CARDIAC: RRR she has a very isolated , very brief systolic murmur in her mid axillary line  RESPIRATORY:  Clear to auscultation without rales, wheezing or rhonchi  ABDOMEN: Soft, non-tender, non-distended MUSCULOSKELETAL:  No edema; No deformity  SKIN: Warm and dry NEUROLOGIC:  Alert and oriented x 3   ECG:    EKG Interpretation Date/Time:  Friday Dec 29 2023 16:24:42 EDT Ventricular Rate:  73 PR Interval:  178 QRS Duration:  84 QT Interval:  398 QTC Calculation: 438 R Axis:   31  Text Interpretation: Normal sinus rhythm Normal ECG When compared with ECG of 17-Sep-2018 14:23, Nonspecific T wave abnormality no longer evident in  Inferior leads Nonspecific T wave abnormality no longer evident in Anterior leads Confirmed by Ahmad Alert ([id number]) on 12/29/2023 4:37:16 PM    Assessment / Plan:    1. Hypertension -  BP is well controlled.   She has episodes of lightheadedness .  I've asked her to watch her BP especially when she is dizzy.  If her bp is dropping low, it may be a sign of volume depletion .     2. Mitral valve prolapse.    Her exam sounds stable . She has moderate MR .       3. Hyperlipidemia-  Check lipids today       4.  Raynaud's disease:  none recently   5.  DOE :   I suspect she may be getting a bit deconditioned .  She is not doing as much cardio recently . Advised her to work on more cardio to see if this helps  Ahmad Alert, MD   12/29/2023 5:17 PM    [facility] [address] [place],  [address] [place], [place]  [address] Pager [phone] Phone: [phone]; Fax: [phone]

## 2023-12-29 ENCOUNTER — Ambulatory Visit: Attending: Cardiology | Admitting: Cardiovascular Disease

## 2023-12-29 ENCOUNTER — Encounter: Payer: Self-pay | Admitting: Cardiovascular Disease

## 2023-12-29 VITALS — BP 128/64 | HR 76 | Ht 66.0 in | Wt 145.6 lb

## 2023-12-29 DIAGNOSIS — I341 Nonrheumatic mitral (valve) prolapse: Secondary | ICD-10-CM | POA: Diagnosis not present

## 2023-12-29 DIAGNOSIS — I340 Nonrheumatic mitral (valve) insufficiency: Secondary | ICD-10-CM

## 2023-12-29 NOTE — Patient Instructions (Signed)
 Medication Instructions:  Your physician recommends that you continue on your current medications as directed. Please refer to the Current Medication list given to you today.  *If you need a refill on your cardiac medications before your next appointment, please call your pharmacy*  Lab Work: Your physician recommends that you return for lab work next week for Lipid panel, BMET and ALT  If you have labs (blood work) drawn today and your tests are completely normal, you will receive your results only by: MyChart Message (if you have MyChart) OR A paper copy in the mail If you have any lab test that is abnormal or we need to change your treatment, we will call you to review the results.  Follow-Up: At [facility], you and your health needs are our priority.  As part of our continuing mission to provide you with exceptional heart care, our providers are all part of one team.  This team includes your primary Cardiologist (physician) and Advanced Practice Providers or APPs (Physician Assistants and Nurse Practitioners) who all work together to provide you with the care you need, when you need it.  Your next appointment:   1 year(s)  Provider:   Dr. Rolm Clos      We recommend signing up for the patient portal called "MyChart".  Sign up information is provided on this After Visit Summary.  MyChart is used to connect with patients for Virtual Visits (Telemedicine).  Patients are able to view lab/test results, encounter notes, upcoming appointments, etc.  Non-urgent messages can be sent to your provider as well.   To learn more about what you can do with MyChart, go to [email or web address].

## 2024-01-01 ENCOUNTER — Other Ambulatory Visit: Payer: Self-pay

## 2024-01-01 DIAGNOSIS — I340 Nonrheumatic mitral (valve) insufficiency: Secondary | ICD-10-CM

## 2024-01-01 DIAGNOSIS — I341 Nonrheumatic mitral (valve) prolapse: Secondary | ICD-10-CM

## 2024-01-01 NOTE — Addendum Note (Signed)
 Addended by: Reyes Caul, Haifa Hatton L on: 01/01/2024 05:09 PM   Modules accepted: Orders

## 2024-01-03 LAB — LIPID PANEL
Chol/HDL Ratio: 2.8 ratio (ref 0.0–4.4)
Cholesterol, Total: 237 mg/dL — ABNORMAL HIGH (ref 100–199)
HDL: 84 mg/dL (ref >39)
LDL Chol Calc (NIH): 125 mg/dL — ABNORMAL HIGH (ref 0–99)
Triglycerides: 165 mg/dL — ABNORMAL HIGH (ref 0–149)
VLDL Cholesterol Cal: 28 mg/dL (ref 5–40)

## 2024-01-03 LAB — BASIC METABOLIC PANEL WITH GFR
BUN/Creatinine Ratio: 16 (ref 12–28)
BUN: 16 mg/dL (ref 8–27)
CO2: 18 mmol/L — ABNORMAL LOW (ref 20–29)
Calcium: 9.2 mg/dL (ref 8.7–10.3)
Chloride: 104 mmol/L (ref 96–106)
Creatinine, Ser: 0.98 mg/dL (ref 0.57–1.00)
Glucose: 93 mg/dL (ref 70–99)
Potassium: 4.2 mmol/L (ref 3.5–5.2)
Sodium: 140 mmol/L (ref 134–144)
eGFR: 65 mL/min/{1.73_m2} (ref >59)

## 2024-01-03 LAB — ALT: ALT: 16 IU/L (ref 0–32)

## 2024-01-04 ENCOUNTER — Telehealth: Payer: Self-pay

## 2024-01-04 DIAGNOSIS — E782 Mixed hyperlipidemia: Secondary | ICD-10-CM

## 2024-01-04 NOTE — Telephone Encounter (Signed)
-----   Message from Ahmad Alert sent at 01/03/2024 10:29 AM EDT ----- LDL is 125 ( mildly elevated) We briefly discussed getting a coronary calcium  score at her recent office visit  Lets get a coronary calcium  score which will help determine how aggressive we should be with any lipid lowering therapy .

## 2024-01-04 NOTE — Telephone Encounter (Signed)
 Called and spoke with patient who states that she stopped her statin 6 months ago due to muscle loss. She is willing to restart, but does want to do CT to see what it shows and hopefully explore other options. Order placed at this time, pt aware of $99 price.

## 2024-01-18 ENCOUNTER — Ambulatory Visit: Payer: Self-pay | Admitting: Internal Medicine

## 2024-01-18 ENCOUNTER — Ambulatory Visit (HOSPITAL_BASED_OUTPATIENT_CLINIC_OR_DEPARTMENT_OTHER)
Admission: RE | Admit: 2024-01-18 | Discharge: 2024-01-18 | Disposition: A | Discharge status: Home / Self Care | Payer: Self-pay | Source: Ambulatory Visit | Attending: Cardiovascular Disease | Admitting: Cardiovascular Disease

## 2024-01-18 DIAGNOSIS — E782 Mixed hyperlipidemia: Secondary | ICD-10-CM | POA: Insufficient documentation

## 2024-01-24 ENCOUNTER — Telehealth (INDEPENDENT_AMBULATORY_CARE_PROVIDER_SITE_OTHER): Admitting: Family Medicine

## 2024-01-24 ENCOUNTER — Ambulatory Visit (INDEPENDENT_AMBULATORY_CARE_PROVIDER_SITE_OTHER)

## 2024-01-24 ENCOUNTER — Ambulatory Visit: Admitting: Family Medicine

## 2024-01-24 ENCOUNTER — Encounter: Payer: Self-pay | Admitting: Family Medicine

## 2024-01-24 VITALS — BP 132/84 | HR 77 | Ht 66.0 in | Wt 145.0 lb

## 2024-01-24 DIAGNOSIS — M5441 Lumbago with sciatica, right side: Secondary | ICD-10-CM | POA: Diagnosis not present

## 2024-01-24 DIAGNOSIS — M545 Low back pain, unspecified: Secondary | ICD-10-CM | POA: Diagnosis not present

## 2024-01-24 MED ORDER — CYCLOBENZAPRINE HCL 5 MG PO TABS: 5.0000 mg | ORAL_TABLET | Freq: Three times a day (TID) | ORAL | 0 refills | Status: DC | PRN

## 2024-01-24 NOTE — Therapy (Signed)
 OUTPATIENT PHYSICAL THERAPY THORACOLUMBAR EVALUATION   Patient Name: Katrina [name]  AKARI DEFELICE MRN: [id number] DOB:27-Mar-1961, 63 y.o., female Today's Date: 01/25/2024  END OF SESSION:  PT End of Session - 01/25/24 1546     Visit Number 1    Number of Visits 7    Date for PT Re-Evaluation 02/22/24    Authorization Type none needed    PT Start Time 1207    PT Stop Time 1251    PT Time Calculation (min) 44 min    Activity Tolerance Patient tolerated treatment well;Patient limited by pain    Behavior During Therapy [facility] for tasks assessed/performed             Past Medical History:  Diagnosis Date   Allergy    Anal fissure    Arthritis    not dx'd  but in hands    C. difficile diarrhea    04/2013   Constipation    chronic her entire life   Diverticulosis    Family history of breast cancer in first degree relative    Frozen shoulder    history of bilateral frozen shoulder   GERD (gastroesophageal reflux disease)    past hx- many years ago    Headache(784.0)    History of abnormal Pap smear    S/P lazer conization   History of stress test    Stress echocardiogram 7/19: Normal clinical, ECG and echo response to stress.  Note very frequent monomorphic PVCs especially in early recovery.   Hyperlipidemia    controlled    Hypertension    controlled    Lupus anticoagulant disorder (HCC)    refuted by hematologist in 2014   Menopause    Mitral regurgitation    Echo 6/19:  EF 60-65, no RWMA, Gr 1 DD, mild post leaflet MVP, mod MR, normal RVSF, mild TR // Echocardiogram 06/2019: EF 60-65, myxomatous mitral valve with mild bileaflet prolapse and mod MR, mild AoV sclerosis (no AS), RVSP 30    Mitral valve prolapse    a. echo 01/2009 showed EF 60-65%, systolic bowing of mitral valve without prolapse.    Near syncope 09/17/2018   Orthostatic hypotension 09/17/2018   Osteopenia    Raynaud disease    a. question false positive antiphospholipid panel-> sx later felt due to Raynaud  itself (transient finger discoloration) rather than embolic phenomena.   Vitamin D  deficiency    Past Surgical History:  Procedure Laterality Date   COLONOSCOPY     FOOT SURGERY  2007   left   KNEE SURGERY Bilateral 2006   for torn meniscus    WISDOM TOOTH EXTRACTION  1981   Patient Active Problem List   Diagnosis Date Noted   Acute right-sided low back pain with left-sided sciatica 01/25/2024   Near syncope 09/17/2018   Orthostatic hypotension 09/17/2018   Raynaud's disease 07/12/2016   Hyperlipidemia 12/01/2015   History of meniscal tear 01/12/2015   Migraine 12/24/2013   Constipation 06/04/2012   MVP (mitral valve prolapse)    Benign essential HTN 05/18/2007   GERD 05/18/2007   Disorder of bone and cartilage 05/18/2007    PCP: shannon banks, MD  REFERRING PROVIDER: Garlan Juniper, MD  REFERRING DIAG:  Diagnosis  M54.50 (ICD-10-CM) - Acute right-sided low back pain without sciatica   Rationale for Evaluation and Treatment: Rehabilitation  THERAPY DIAG:  Acute midline low back pain with right-sided sciatica  Other abnormalities of gait and mobility  ONSET DATE: 01/20/24  SUBJECTIVE:  SUBJECTIVE STATEMENT: Saturday I was noticing that my back was stiff and Sunday I was having very bad pain almost like childbirth.  Wednesday I did 2 exercise classes and lifted a heavy box. And that is all I can think of that would have hurt it.  Tried DN from a PT friend, massage, ice/heat. Given flexeril but have not used it. Alternating ice and ibuprofen .  Sitting down is the most uncomfortable.  I've never had pain like this before.  Had xrays but they are not back.  I got my TENS unit ordered.    PERTINENT HISTORY:  Osteoporosis -  sig in the lumbar spine and bil hips  PAIN:  Are you having pain?  Yes: NPRS scale: 1/10 when standing still but up to 8/10  Pain location: sacrum region, over to the right Pain description: More like a dull achy with zinglike pain with certain motions.   Aggravating factors: pain increases when sitting - at the midline.   Relieving factors: standing, laying especially prone,   PRECAUTIONS: osteoporosis   RED FLAGS: None   WEIGHT BEARING RESTRICTIONS: No  FALLS:  Has patient fallen in last 6 months? No  LIVING ENVIRONMENT: Lives with: lives with their family and lives with their spouse  OCCUPATION: Technical sales engineer   PLOF: Independent  PATIENT GOALS: decrease pain  NEXT MD VISIT: As needed   OBJECTIVE:  Note: Objective measures were completed at Evaluation unless otherwise noted.  DIAGNOSTIC FINDINGS:  Xrays not back yet   PATIENT SURVEYS: ODI not completed at Eval   COGNITION: Overall cognitive status: Within functional limits for tasks assessed     SENSATION: Nothing down the leg at this point but it was going down the Rt leg initially   MUSCLE LENGTH: Hamstrings: Right 80 deg; Left 80 deg - negative SLR and negative with added DF in supine Prone knee bend / quads to around 60deg bil without pain Piriformis WNL bil  POSTURE: guarded stance and gait and transfers  PALPATION: No ttp bil lumbar regions and glute regions - slight bruising on Rt side from needling No pain midline sacrum or spinous processes   LUMBAR ROM:   AROM eval  Flexion Very minimal available before pain  Extension 75% more fearful that painful  Right lateral flexion WNL but slow  Left lateral flexion WNL but slow  Right rotation WNL  Left rotation WNL   (Blank rows = not tested)  LOWER EXTREMITY ROM:    Prone hip ER/IR WNL bil and hip ROM WNL supine  LOWER EXTREMITY MMT:    MMT Right eval Left eval  Hip flexion    Hip extension    Hip abduction    Hip adduction    Hip internal rotation    Hip external rotation    Knee flexion    Knee  extension    Ankle dorsiflexion    Ankle plantarflexion    Ankle inversion    Ankle eversion     (Blank rows = not tested)  LUMBAR SPECIAL TESTS:  Straight leg raise test: Negative, Slump test: Negative, and Single leg stance test: Negative -Pain reproduced with standing and seated lumbar flexion and supine PPT  GAIT: Distance walked: from waiting room to clinic room around 49ft - slow and guarded   TREATMENT DATE:  01/25/24 Eval performed Supine SKTC 2x10" bil DKTC x 10" with slow side to side rocking LTR 5" x 5 bil PPT ROM with hands on hips x 6 TrA activation with slow march  of 5" in height   All added to HEP Pt will continue with heat, ice, TENS, antiinflammatories, and will pursue DN from her friend if needed.                                                                                                                          PATIENT EDUCATION:  Education details: POC, initial HEP Person educated: Patient Education method: Programmer, multimedia, Demonstration, Tactile cues, Verbal cues, and Handouts Education comprehension: verbalized understanding, returned demonstration, and needs further education  HOME EXERCISE PROGRAM: Code not copied in time but included:  Supine SKTC 2x10" bil DKTC x 10" with slow side to side rocking LTR 5" x 5 bil PPT ROM with hands on hips x 6 TrA activation with slow march of 5" in height  ASSESSMENT:  CLINICAL IMPRESSION: Patient is a 63 y.o. female who was seen today for physical therapy evaluation and treatment for acute midline low back pain with referral into the Rt glute region.  This pain was severe 4 days ago to the point that she was unable to walk.  It is now centralizing and is present only with turning in the bed, sitting with any PPT, and with lumbar flexion AROM.  Her Flexion is almost none.  Surprisingly she had no ttp to the back and Rt side today.  Did not assess accessory motions due to pending xray with significant osteoporosis  hx.  Overall pt is improving but will continue with PT to return to baseline motion and be able to return to her normal osteostrong and trainer activities.  She would benefit from core re-education.   OBJECTIVE IMPAIRMENTS: Abnormal gait, cardiopulmonary status limiting activity, decreased activity tolerance, decreased mobility, and decreased ROM.   ACTIVITY LIMITATIONS: carrying, lifting, bending, sitting, and sleeping  PARTICIPATION LIMITATIONS: meal prep, cleaning, laundry, and community activity  PERSONAL FACTORS: none are also affecting patient's functional outcome.   REHAB POTENTIAL: Excellent  CLINICAL DECISION MAKING: Stable/uncomplicated  EVALUATION COMPLEXITY: Low   GOALS: Goals reviewed with patient? Yes  SHORT TERM GOALS=LTGs: Target date: 02/22/24  Pt will improve lumbar flexion to WNL to demonstrate improved spinal mobility  Baseline: around 2" of AROM of the fingertips  Goal status: INITIAL  2.  Pt will tolerate sitting without limitations due to decrease pain Baseline: unable to sit comfortably  Goal status: INITIAL  3.  Pt will return to gym activities without limitations with education on core bracing prior to heavier activities Baseline: reports her trainer has her do high level core with heavy loads Goal status: INITIAL  4.  Pt will be ind with final HEP Baseline:  Goal status: INITIAL   PLAN:  PT FREQUENCY: 1-2x/week  PT DURATION: 4 weeks  PLANNED INTERVENTIONS: 97110-Therapeutic exercises, 97530- Therapeutic activity, [id number]- Neuromuscular re-education, 97535- Self Care, [id number]- Manual therapy, [id number]- Gait training, [phone]- Electrical stimulation (unattended), 21308- Traction (mechanical), Patient/Family education, Taping, and Dry Needling., heat/ice  PLAN FOR NEXT SESSION: lumbar ROM, APT/PPT, improving flexion  as tolerated, gentle core work - added DN but pt unsure if she would do anymore.    Encarnacion Harris, PT 01/25/2024, 3:47 PM

## 2024-01-24 NOTE — Progress Notes (Signed)
 Virtual Visit via Video Note  I connected with Katrina  "[name]" [name] on 01/24/24 at 11:30 AM EDT by a video enabled telemedicine application and verified that I am speaking with the correct person using two identifiers.  Location patient: home Location provider:work or home office Persons participating in the virtual visit: patient, provider  I discussed the limitations of evaluation and management by telemedicine and the availability of in person appointments. The patient expressed understanding and agreed to proceed. Chief Complaint  Patient presents with   Medical Management of Chronic Issues    Lower Back and hip pain, weight training on wed, started sat, rate of pain 8 out of 10, shooting pain, dry needling, icing/heat,  having trouble walking,     HPI:  Pt is a 63 yo female with h/o osteoporosis in spine who is seen for acute concern.  Pt exercising regularly and in a fitness class to help ppl who have bone density issues.   Pt lifted a heavy box mid wk last wk.  By Saturday her back started to tighten up.  By Sunday felt like "child birth pain", was unable to stand.  Tried tylenol, salonpas, ibuprofen , massage, ice, dry needling.  Able to walk gingerly today.   Pt's friend who is a physical therapist mentioned her muscles being inflamed on the right side.  Low back pain R>L with some radiation into LE.  ROS: See pertinent positives and negatives per HPI.  Past Medical History:  Diagnosis Date   Allergy    Anal fissure    Arthritis    not dx'd  but in hands    C. difficile diarrhea    04/2013   Constipation    chronic her entire life   Diverticulosis    Family history of breast cancer in first degree relative    Frozen shoulder    history of bilateral frozen shoulder   GERD (gastroesophageal reflux disease)    past hx- many years ago    Headache(784.0)    History of abnormal Pap smear    S/P lazer conization   History of stress test    Stress echocardiogram  7/19: Normal clinical, ECG and echo response to stress.  Note very frequent monomorphic PVCs especially in early recovery.   Hyperlipidemia    controlled    Hypertension    controlled    Lupus anticoagulant disorder (HCC)    refuted by hematologist in 2014   Menopause    Mitral regurgitation    Echo 6/19:  EF 60-65, no RWMA, Gr 1 DD, mild post leaflet MVP, mod MR, normal RVSF, mild TR // Echocardiogram 06/2019: EF 60-65, myxomatous mitral valve with mild bileaflet prolapse and mod MR, mild AoV sclerosis (no AS), RVSP 30    Mitral valve prolapse    a. echo 01/2009 showed EF 60-65%, systolic bowing of mitral valve without prolapse.    Near syncope 09/17/2018   Orthostatic hypotension 09/17/2018   Osteopenia    Raynaud disease    a. question false positive antiphospholipid panel-> sx later felt due to Raynaud itself (transient finger discoloration) rather than embolic phenomena.   Vitamin D  deficiency     Past Surgical History:  Procedure Laterality Date   COLONOSCOPY     FOOT SURGERY  2007   left   KNEE SURGERY Bilateral 2006   for torn meniscus    WISDOM TOOTH EXTRACTION  1981    Family History  Problem Relation Age of Onset   Colon cancer Mother 35  Breast cancer Mother    Hypertension Mother    Anemia Father    Heart disease Father        CHF   Lung disease Father    Atrial fibrillation Father    COPD Father    Breast cancer Maternal Aunt    Cancer Paternal Aunt        brain   Heart disease Maternal Grandmother    Cancer Maternal Grandfather        throat   Esophageal cancer Maternal Grandfather    Stroke Sister    Lymphoma Brother    Irritable bowel syndrome Daughter    Other Daughter        Primary Sclerosing Cholangitis   Heart attack Neg Hx    Colon polyps Neg Hx    Rectal cancer Neg Hx    Stomach cancer Neg Hx     SOCIAL HX: Pt is an Technical sales engineer.   Current Outpatient Medications:    albuterol  (PROAIR  HFA) 108 (90 Base) MCG/ACT inhaler, Inhale 2 puffs  into the lungs every 6 (six) hours as needed for wheezing or shortness of breath., Disp: 1 each, Rfl: 0   diltiazem  (CARDIZEM  CD) 120 MG 24 hr capsule, Take 1 capsule (120 mg total) by mouth daily., Disp: 90 capsule, Rfl: 3   lisinopril  (ZESTRIL ) 10 MG tablet, Take 1 tablet (10 mg total) by mouth daily., Disp: 90 tablet, Rfl: 3   loratadine (CLARITIN) 10 MG tablet, Take 10 mg by mouth daily., Disp: , Rfl:    polyethylene glycol powder (GLYCOLAX/MIRALAX) 17 GM/SCOOP powder, Take 1 Container by mouth as needed., Disp: , Rfl:    Probiotic Product (PROBIOTIC DAILY PO), Take by mouth. Align Probiotic, Disp: , Rfl:    propranolol  (INDERAL ) 10 MG tablet, Take one tab 1 hour prior to event., Disp: 60 tablet, Rfl: 2   Abaloparatide  (TYMLOS ) 3120 MCG/1.56ML SOPN, Inject 80 mcg into the skin daily. (Patient not taking: Reported on 12/29/2023), Disp: 1.56 mL, Rfl: 12   atorvastatin  (LIPITOR) 20 MG tablet, Take 1 tablet (20 mg total) by mouth daily., Disp: 90 tablet, Rfl: 3   B Complex Vitamins (VITAMIN-B COMPLEX PO), Take by mouth. (Patient not taking: Reported on 12/29/2023), Disp: , Rfl:    hydrochlorothiazide  (MICROZIDE ) 12.5 MG capsule, Take 1 capsule (12.5 mg total) by mouth daily as needed. (Patient not taking: Reported on 12/29/2023), Disp: , Rfl:    Insulin  Pen Needle (ASSURE ID DUO PRO PEN NEEDLES) 31G X 5 MM MISC, 1 each by Does not apply route daily., Disp: 30 each, Rfl: 24   linaclotide  (LINZESS ) 145 MCG CAPS capsule, Take 1 capsule (145 mcg total) by mouth daily before breakfast. (Patient not taking: Reported on 12/08/2022), Disp: 30 capsule, Rfl: 3   meloxicam (MOBIC) 15 MG tablet, Take 15 mg by mouth daily., Disp: , Rfl:    potassium chloride  (KLOR-CON ) 10 MEQ tablet, Take 2 tablets (20 mEq total) by mouth 2 (two) times daily. (Patient not taking: Reported on 12/29/2023), Disp: 120 tablet, Rfl: 1  EXAM:  VITALS per patient if applicable:  RR between 12-20 bpm  GENERAL: alert, oriented, appears well and  in no acute distress, laying in bed.  HEENT: atraumatic, conjunctiva clear, no obvious abnormalities on inspection of external nose and ears  NECK: normal movements of the head and neck  LUNGS: on inspection no signs of respiratory distress, breathing rate appears normal, no obvious gross SOB, gasping or wheezing  CV: no obvious cyanosis  MS: moves all  visible extremities without noticeable abnormality  PSYCH/NEURO: pleasant and cooperative, no obvious depression or anxiety, speech and thought processing grossly intact  ASSESSMENT AND PLAN:  Discussed the following assessment and plan:  Acute bilateral low back pain without sciatica - Plan: cyclobenzaprine (FLEXERIL) 5 MG tablet  Low back pain likely 2/2 to muscle strain with sciatica.  Also consider radiculopathy.  No red flag sx.  Supportive care with heat, ice, massage, stretching, tylenol or NSAIDS, topical analgesics.  MSK rxlr prn.  Advised may cause drowsiness.  Reviewed various stretching exercises. Discussed likely duration of symptoms.  F/u for continued or worsened symptoms.   Pt to schedule CPE in the next few wks.   I discussed the assessment and treatment plan with the patient. The patient was provided an opportunity to ask questions and all were answered. The patient agreed with the plan and demonstrated an understanding of the instructions.   The patient was advised to call back or seek an in-person evaluation if the symptoms worsen or if the condition fails to improve as anticipated.   Viola Greulich, MD

## 2024-01-24 NOTE — Progress Notes (Signed)
   I, Miquel Amen, CMA acting as a scribe for Garlan Juniper, MD.  Katrina [name] is a 63 y.o. female who presents to [facility] at [facility] today for back pain x 4 days. Pt locates pain to right side lower back. Works with a Psychologist, educational for Osteoporosis, also goes to [employer]. Notes lifting a heavy box into the trunk the Wednesday before. Prescribed Cyclobenzaprine by PCP, has not started yet.   Radiating pain: into gluteal region, groin LE numbness/tingling: R LE LE weakness: some weakness this past Sunday.  Aggravates: raising left leg to chest Treatments tried: dry-needling, IBU, Tylenol, Salonpas, Biofreeze, heat, massage, ice  Pertinent review of systems: No fevers or chills  Relevant historical information: Raynaud's disease Osteoporosis with a T-score of -3.5.  Patient is treating osteoporosis with weightbearing exercise, osteo strong, and alendronate.  Exam:  BP 132/84   Pulse 77   Ht 5\' 6"  (1.676 m)   Wt 145 lb (65.8 kg)   SpO2 98%   BMI 23.40 kg/m  General: Well Developed, well nourished, and in no acute distress.   MSK: L-spine: Normal appearing Nontender palpation spinal midline.  Tender palpation paraspinal musculature. Decreased lumbar motion. Lower extremity strength reflexes are intact.    Lab and Radiology Results  X-ray images lumbar spine obtained today personally and independently interpreted. DDD L5-S1.  No acute compression fractures identified. Await formal radiology review    Assessment and Plan: 63 y.o. female with acute exacerbation of low back pain.  This is a chronic intermittent problem with an acute exacerbation over the last few days.  Muscle spasm and dysfunction the chief differential diagnosis.  She is a good candidate for physical therapy.  Plan to refer to PT.  Recommend heating pad or ice, TENS unit.  Additionally continue Tylenol and ibuprofen  and cyclobenzaprine that has been prescribed.  Recheck if not  improving.  We also spent some time talking about osteoporosis.  She has pretty bad osteoporosis especially for her activity level and age.  Her T-score is reportedly improving with osteo strong which is a good step in the right direction.  If her scores are not improving or she has a fracture would consider anabolic agent such as teriparatide.   PDMP not reviewed this encounter. Orders Placed This Encounter  Procedures   DG Lumbar Spine 2-3 Views    Standing Status:   Future    Number of Occurrences:   1    Expiration Date:   02/24/2024    Reason for Exam (SYMPTOM  OR DIAGNOSIS REQUIRED):   low back pain    Preferred imaging location?:    [facility]   Ambulatory referral to Physical Therapy    Referral Priority:   Routine    Referral Type:   Physical Medicine    Referral Reason:   Specialty Services Required    Requested Specialty:   Physical Therapy    Number of Visits Requested:   1   No orders of the defined types were placed in this encounter.    Discussed warning signs or symptoms. Please see discharge instructions. Patient expresses understanding.   The above documentation has been reviewed and is accurate and complete Garlan Juniper, M.D.

## 2024-01-24 NOTE — Progress Notes (Signed)
 Patient was unable to self-report due to a lack of equipment at home via telehealth

## 2024-01-24 NOTE — Patient Instructions (Addendum)
 Thank you for coming in today.   A referral for physical therapy has been submitted. A representative from the physical therapy office will contact you to coordinate scheduling after confirming your benefits with your insurance provider. If you do not hear from the physical therapy office within the next 1-2 weeks, please let us  know.  Please get an Xray today before you leave    TENS UNIT: This is helpful for muscle pain and spasm.   Search and Purchase a TENS 7000 2nd edition at  www.tenspros.com or www.Amazon.com It should be less than $30.     TENS unit instructions: Do not shower or bathe with the unit on Turn the unit off before removing electrodes or batteries If the electrodes lose stickiness add a drop of water to the electrodes after they are disconnected from the unit and place on plastic sheet. If you continued to have difficulty, call the TENS unit company to purchase more electrodes. Do not apply lotion on the skin area prior to use. Make sure the skin is clean and dry as this will help prolong the life of the electrodes. After use, always check skin for unusual red areas, rash or other skin difficulties. If there are any skin problems, does not apply electrodes to the same area. Never remove the electrodes from the unit by pulling the wires. Do not use the TENS unit or electrodes other than as directed. Do not change electrode placement without consultating your therapist or physician. Keep 2 fingers with between each electrode. Wear time ratio is 2:1, on to off times.    For example on for 30 minutes off for 15 minutes and then on for 30 minutes off for 15 minutes

## 2024-01-25 ENCOUNTER — Other Ambulatory Visit: Payer: Self-pay

## 2024-01-25 ENCOUNTER — Encounter: Payer: Self-pay | Admitting: Rehabilitation

## 2024-01-25 ENCOUNTER — Ambulatory Visit: Attending: Family Medicine | Admitting: Rehabilitation

## 2024-01-25 DIAGNOSIS — M545 Low back pain, unspecified: Secondary | ICD-10-CM | POA: Diagnosis not present

## 2024-01-25 DIAGNOSIS — M5442 Lumbago with sciatica, left side: Secondary | ICD-10-CM | POA: Insufficient documentation

## 2024-01-25 DIAGNOSIS — M5441 Lumbago with sciatica, right side: Secondary | ICD-10-CM | POA: Diagnosis present

## 2024-01-25 DIAGNOSIS — R2689 Other abnormalities of gait and mobility: Secondary | ICD-10-CM | POA: Insufficient documentation

## 2024-01-30 ENCOUNTER — Ambulatory Visit: Attending: Family Medicine

## 2024-01-30 DIAGNOSIS — M5441 Lumbago with sciatica, right side: Secondary | ICD-10-CM | POA: Diagnosis present

## 2024-01-30 DIAGNOSIS — R2689 Other abnormalities of gait and mobility: Secondary | ICD-10-CM | POA: Insufficient documentation

## 2024-01-30 NOTE — Therapy (Addendum)
 OUTPATIENT PHYSICAL THERAPY TREATMENT   Patient Name: Katrina [name] MRN: [id number] DOB:1961-08-28, 63 y.o., female Today's Date: 03/06/2024  END OF SESSION:     Past Medical History:  Diagnosis Date   Allergy    Anal fissure    Arthritis    not dx'd  but in hands    C. difficile diarrhea    04/2013   Constipation    chronic her entire life   Diverticulosis    Family history of breast cancer in first degree relative    Frozen shoulder    history of bilateral frozen shoulder   GERD (gastroesophageal reflux disease)    past hx- many years ago    Headache(784.0)    History of abnormal Pap smear    S/P lazer conization   History of stress test    Stress echocardiogram 7/19: Normal clinical, ECG and echo response to stress.  Note very frequent monomorphic PVCs especially in early recovery.   Hyperlipidemia    controlled    Hypertension    controlled    Lupus anticoagulant disorder (HCC)    refuted by hematologist in 2014   Menopause    Mitral regurgitation    Echo 6/19:  EF 60-65, no RWMA, Gr 1 DD, mild post leaflet MVP, mod MR, normal RVSF, mild TR // Echocardiogram 06/2019: EF 60-65, myxomatous mitral valve with mild bileaflet prolapse and mod MR, mild AoV sclerosis (no AS), RVSP 30    Mitral valve prolapse    a. echo 01/2009 showed EF 60-65%, systolic bowing of mitral valve without prolapse.    Near syncope 09/17/2018   Orthostatic hypotension 09/17/2018   Osteopenia    Raynaud disease    a. question false positive antiphospholipid panel-> sx later felt due to Raynaud itself (transient finger discoloration) rather than embolic phenomena.   Vitamin D  deficiency    Past Surgical History:  Procedure Laterality Date   COLONOSCOPY     FOOT SURGERY  2007   left   KNEE SURGERY Bilateral 2006   for torn meniscus    WISDOM TOOTH EXTRACTION  1981   Patient Active Problem List   Diagnosis Date Noted   Acute right-sided low back pain with left-sided sciatica  01/25/2024   Near syncope 09/17/2018   Orthostatic hypotension 09/17/2018   Raynaud's disease 07/12/2016   Hyperlipidemia 12/01/2015   History of meniscal tear 01/12/2015   Migraine 12/24/2013   Constipation 06/04/2012   MVP (mitral valve prolapse)    Benign essential HTN 05/18/2007   GERD 05/18/2007   Disorder of bone and cartilage 05/18/2007    PCP: shannon banks, MD  REFERRING PROVIDER: artist lloyd, MD  REFERRING DIAG:  Diagnosis  M54.50 (ICD-10-CM) - Acute right-sided low back pain without sciatica   Rationale for Evaluation and Treatment: Rehabilitation  THERAPY DIAG:  Acute midline low back pain with right-sided sciatica  Other abnormalities of gait and mobility  ONSET DATE: 01/20/24  SUBJECTIVE:  SUBJECTIVE STATEMENT: I am getting better. Able to do yard work. I'll return to my trainer and [facility] this week.   PERTINENT HISTORY:  Osteoporosis -  sig in the lumbar spine and bil hips  PAIN:  Are you having pain? Yes: NPRS scale: 1-2/10  Pain location: sacrum region, over to the right Pain description: More like a dull achy with zinglike pain with certain motions.   Aggravating factors: pain increases when sitting - at the midline.   Relieving factors: standing, laying especially prone,   PRECAUTIONS: osteoporosis   RED FLAGS: None   WEIGHT BEARING RESTRICTIONS: No  FALLS:  Has patient fallen in last 6 months? No  LIVING ENVIRONMENT: Lives with: lives with their family and lives with their spouse  OCCUPATION: Technical sales engineer   PLOF: Independent  PATIENT GOALS: decrease pain  NEXT MD VISIT: As needed   OBJECTIVE:  Note: Objective measures were completed at Evaluation unless otherwise noted.  DIAGNOSTIC FINDINGS:  Xrays not back yet   PATIENT SURVEYS: ODI not  completed at Eval   COGNITION: Overall cognitive status: Within functional limits for tasks assessed     SENSATION: Nothing down the leg at this point but it was going down the Rt leg initially   MUSCLE LENGTH: Hamstrings: Right 80 deg; Left 80 deg - negative SLR and negative with added DF in supine Prone knee bend / quads to around 60deg bil without pain Piriformis WNL bil  POSTURE: guarded stance and gait and transfers  PALPATION: No ttp bil lumbar regions and glute regions - slight bruising on Rt side from needling No pain midline sacrum or spinous processes   LUMBAR ROM:   AROM eval  Flexion Very minimal available before pain  Extension 75% more fearful that painful  Right lateral flexion WNL but slow  Left lateral flexion WNL but slow  Right rotation WNL  Left rotation WNL   (Blank rows = not tested)  LOWER EXTREMITY ROM:    Prone hip ER/IR WNL bil and hip ROM WNL supine  LOWER EXTREMITY MMT:    MMT Right eval Left eval  Hip flexion    Hip extension    Hip abduction    Hip adduction    Hip internal rotation    Hip external rotation    Knee flexion    Knee extension    Ankle dorsiflexion    Ankle plantarflexion    Ankle inversion    Ankle eversion     (Blank rows = not tested)  LUMBAR SPECIAL TESTS:  Straight leg raise test: Negative, Slump test: Negative, and Single leg stance test: Negative -Pain reproduced with standing and seated lumbar flexion and supine PPT  GAIT: Distance walked: from waiting room to clinic room around 22ft - slow and guarded   TREATMENT DATE:  01/30/24 Seated hamstring and piriformis stretch 3x20 seconds  Pallof press- green band x10 each Forward T x8 each Supine SKTC 2x10 bil DKTC x 10 with slow side to side rocking LTR 5 x 5 bil Sidelying clams x10- advised to add band at home when ready TrA activation toe taps Dead bug x10-emphasis on neutral core and slow movement                      01/25/24 Eval  performed Supine SKTC 2x10 bil DKTC x 10 with slow side to side rocking LTR 5 x 5 bil PPT ROM with hands on hips x 6 TrA activation with slow march of 5  in height   All added to HEP Pt will continue with heat, ice, TENS, antiinflammatories, and will pursue DN from her friend if needed.                                                                                                                          PATIENT EDUCATION:  Education details: POC, initial HEP Person educated: Patient Education method: Explanation, Demonstration, Tactile cues, Verbal cues, and Handouts Education comprehension: verbalized understanding, returned demonstration, and needs further education  HOME EXERCISE PROGRAM: Access Code: [email or web address] URL: [email or web address] Date: 01/30/2024 Prepared by: Burnard  Exercises - Supine Single Knee to Chest Stretch  - 2-3 x daily - 7 x weekly - 1 sets - 3 reps - 20-30 hold - Supine Double Knee to Chest Modified  - 2-3 x daily - 7 x weekly - 1 sets - 3 reps - 20-30 hold - Seated Hamstring Stretch  - 2-3 x daily - 7 x weekly - 1 sets - 3 reps - 20-30 hold - Seated Figure 4 Piriformis Stretch  - 2-3 x daily - 7 x weekly - 1 sets - 3 reps - 20-30 hold - Supine Dead Bug with Leg Extension  - 1 x daily - 7 x weekly - 1-2 sets - 10 reps - Clamshell  - 1 x daily - 7 x weekly - 1-2 sets - 10 reps - Supine Hip Adduction Isometric with Ball  - 1 x daily - 7 x weekly - 3 sets - 10 reps - Anti-Rotation Press With Sidesteps and Anchored Resistance  - 1 x daily - 7 x weekly - 2 sets - 10 reps - Supine Lower Trunk Rotation  - 2 x daily - 7 x weekly - 1 sets - 2-3 reps  ASSESSMENT:  CLINICAL IMPRESSION: Pt with first time follow-up and is feeling much improved.  She has returned to some yard work, work at the office and remains guarded with transitional movements.  She rates pain the Rt gluteals as sore and 1-2/10.  Session spent reviewing HEP and adding to HEP for core  and hip strength.  Emphasized importance of neutral core and TA activation with home tasks and exercise.  She did well with addition to HEP and required minor cueing for neutral spine, scapular depression and controlled movement.  She will be placed on hold as she will return to her trainer and incorporate baseline core strength and flexibility into routine for better outcome.    OBJECTIVE IMPAIRMENTS: Abnormal gait, cardiopulmonary status limiting activity, decreased activity tolerance, decreased mobility, and decreased ROM.   ACTIVITY LIMITATIONS: carrying, lifting, bending, sitting, and sleeping  PARTICIPATION LIMITATIONS: meal prep, cleaning, laundry, and community activity  PERSONAL FACTORS: none are also affecting patient's functional outcome.   REHAB POTENTIAL: Excellent  CLINICAL DECISION MAKING: Stable/uncomplicated  EVALUATION COMPLEXITY: Low   GOALS: Goals reviewed with patient? Yes  SHORT TERM GOALS=LTGs: Target date: 02/22/24  Pt will improve lumbar flexion to WNL to demonstrate  improved spinal mobility  Baseline: around 2 of AROM of the fingertips  Goal status: INITIAL  2.  Pt will tolerate sitting without limitations due to decrease pain Baseline: able to sit and stand alternating at work (01/30/24)  Goal status: MET  3.  Pt will return to gym activities without limitations with education on core bracing prior to heavier activities Baseline: reports her trainer has her do high level core with heavy loads Goal status: INITIAL  4.  Pt will be ind with final HEP Baseline:  Goal status: INITIAL   PLAN:  PT FREQUENCY: 1-2x/week  PT DURATION: 4 weeks  PLANNED INTERVENTIONS: 97110-Therapeutic exercises, 97530- Therapeutic activity, [id number]- Neuromuscular re-education, 97535- Self Care, [id number]- Manual therapy, [id number]- Gait training, [id number]- Electrical stimulation (unattended), [id number]- Traction (mechanical), Patient/Family education, Taping, and Dry Needling.,  heat/ice  PLAN FOR NEXT SESSION: Place on hold.  Pt will call to schedule if she needs to.    Burnard Joy, PT 03/06/24 10:48 AM  PHYSICAL THERAPY DISCHARGE SUMMARY  Visits from Start of Care: 2  Current functional level related to goals / functional outcomes: See above for current status.  Pt has returned to prior level of function.    Remaining deficits: See above for most current PT status.    Education / Equipment: HEP, osteoporosis do/don't   Patient agrees to discharge. Patient goals were partially met. Patient is being discharged due to being pleased with the current functional level.

## 2024-01-31 ENCOUNTER — Other Ambulatory Visit: Payer: Self-pay | Admitting: Cardiovascular Disease

## 2024-02-02 ENCOUNTER — Ambulatory Visit: Payer: Self-pay | Admitting: Family Medicine

## 2024-02-02 NOTE — Progress Notes (Signed)
 Low back x-ray shows medium arthritis at L5-S1 at the base of the spine.

## 2024-02-06 ENCOUNTER — Ambulatory Visit

## 2024-09-10 ENCOUNTER — Ambulatory Visit: Payer: Self-pay

## 2024-09-10 NOTE — Telephone Encounter (Signed)
 FYI Only or Action Required?: FYI only for provider: appointment scheduled on 09/11/24.  Patient was last seen in primary care on 01/24/2024 by Mercer Clotilda SAUNDERS, MD.  Called Nurse Triage reporting Shortness of Breath, Otalgia, Headache, and Cough.  Symptoms began 3-4 weeks ago.  Interventions attempted: OTC medications: Tylenol, saline nasal spray, Redness eye drops; hot fluids, humidifier.  Symptoms are: gradually worsening.  Triage Disposition: See Physician Within 24 Hours  Patient/caregiver understands and will follow disposition?: Yes        Copied from CRM [id number]. Topic: Clinical - Red Word Triage >> Sep 10, 2024  3:30 PM Katrina [name] wrote: Red Word that prompted transfer to Nurse Triage: Sinus issue for 3-4 weeks.  Constant runny nose, headaches, shortness of breath  Would like an appt with PCP Reason for Disposition  Earache  Answer Assessment - Initial Assessment Questions 1. ONSET: When did the cough begin?      Couple weeks. Other symptoms started 3-4 weeks ago.  2. SEVERITY: How bad is the cough today?      Barking sound at night,   3. SPUTUM: Describe the color of your sputum (e.g., none, dry cough; clear, white, yellow, green)     White.  4. HEMOPTYSIS: Are you coughing up any blood? If Yes, ask: How much? (e.g., flecks, streaks, tablespoons, etc.)     No.  5. DIFFICULTY BREATHING: Are you having difficulty breathing? If Yes, ask: How bad is it? (e.g., mild, moderate, severe)      SOB x couple days (mild) was bale to work out with a systems analyst this AM. Doesn't feel like getting in a full normal breath. Patient speaking in full sentences during triage, no labored breathing/respiratory distress/wheezing noted.  6. FEVER: Do you have a fever? If Yes, ask: What is your temperature, how was it measured, and when did it start?     Not checking temperature; feels feverish, hot and cold flashes, chills.  7. CARDIAC HISTORY: Do you have  any history of heart disease? (e.g., heart attack, congestive heart failure)      MVP, HTN.  8. LUNG HISTORY: Do you have any history of lung disease?  (e.g., pulmonary embolus, asthma, emphysema)     No history of lung disease but states she frequently gets lung infections.  9. PE RISK FACTORS: Do you have a history of blood clots? (or: recent major surgery, recent prolonged travel, bedridden)     No.  10. OTHER SYMPTOMS: Do you have any other symptoms? (e.g., runny nose, wheezing, chest pain)       Sneezing, watery eyes, wheezing the other day when I took a deep breath felt a little wheezy, none present now. Back of head right hand side, right ear pain  11. PREGNANCY: Is there any chance you are pregnant? When was your last menstrual period?       N/A.  12. TRAVEL: Have you traveled out of the country in the last month? (e.g., travel history, exposures)       No travel.  Protocols used: Cough - Acute Productive-A-AH

## 2024-09-11 ENCOUNTER — Encounter: Payer: Self-pay | Admitting: Family Medicine

## 2024-09-11 ENCOUNTER — Ambulatory Visit: Admitting: Family Medicine

## 2024-09-11 VITALS — BP 138/80 | HR 73 | Temp 98.7°F | Ht 66.0 in | Wt 147.3 lb

## 2024-09-11 DIAGNOSIS — J209 Acute bronchitis, unspecified: Secondary | ICD-10-CM | POA: Diagnosis not present

## 2024-09-11 DIAGNOSIS — H6121 Impacted cerumen, right ear: Secondary | ICD-10-CM | POA: Diagnosis not present

## 2024-09-11 MED ORDER — ALBUTEROL SULFATE HFA 108 (90 BASE) MCG/ACT IN AERS: 2.0000 | INHALATION_SPRAY | Freq: Four times a day (QID) | RESPIRATORY_TRACT | 0 refills | Status: AC | PRN

## 2024-09-11 MED ORDER — AZITHROMYCIN 250 MG PO TABS: ORAL_TABLET | ORAL | 0 refills | Status: AC

## 2024-09-11 MED ORDER — METHYLPREDNISOLONE 4 MG PO TBPK: ORAL_TABLET | ORAL | 0 refills | Status: AC

## 2024-09-11 NOTE — Patient Instructions (Signed)
 Debrox

## 2024-09-11 NOTE — Progress Notes (Signed)
 "  Acute Office Visit  Subjective:     Patient ID: Katrina  MAGDELENE [name], female    DOB: July 03, 1961, 64 y.o.   MRN: [id number]  Chief Complaint  Patient presents with   Cough    Productive with white sputum x1 month, denies trial of OTC medication   Headache   Sinus Problem    Patient complains of sinus drainage and watery eyes x1 month    Cough Associated symptoms include headaches.  Headache  Associated symptoms include coughing.  Sinus Problem Associated symptoms include coughing and headaches.  Discussed the use of AI scribe software for clinical note transcription with the patient, who gave verbal consent to proceed.  History of Present Illness   Katrina [name] is a 64 year old female who presents with symptoms suggestive of an upper respiratory infection and possible allergic reaction.  Her symptoms began after bringing a fresh Katrina [name] into the house, similar to prior seasonal allergy flares, and have progressed over about 1.5 weeks to cough with phlegm, headaches, itchy watery eyes, ear discomfort, scratchy sore throat, chills, and a sensation of heaviness in her chest. The cough is productive with white mucus, especially in the morning. She remains able to exercise with a trainer but feels her chest is affected. She uses Tylenol for headaches, saline spray, cool mist humidifiers, and daily Claritin with partial relief. She denies recent sick contacts. She has ear wax buildup noted at a prior visit with an incomplete removal attempt and wonders if this relates to her current ear discomfort.        Review of Systems  Respiratory:  Positive for cough.   Neurological:  Positive for headaches.        Objective:    BP 138/80   Pulse 73   Temp 98.7 F (37.1 C) (Oral)   Ht 5' 6 (1.676 m)   Wt 147 lb 4.8 oz (66.8 kg)   SpO2 97%   BMI 23.77 kg/m    Physical Exam Vitals reviewed.  Constitutional:      Appearance: She is well-developed  and normal weight.  HENT:     Right Ear: There is impacted cerumen.     Left Ear: Tympanic membrane normal.     Mouth/Throat:     Mouth: Mucous membranes are moist.     Pharynx: Pharyngeal swelling and posterior oropharyngeal erythema present.  Cardiovascular:     Rate and Rhythm: Normal rate and regular rhythm.     Heart sounds: Normal heart sounds.  Pulmonary:     Effort: Pulmonary effort is normal.     Breath sounds: Wheezing (upper airway with forced expiration) present.  Musculoskeletal:     Cervical back: Normal range of motion.  Lymphadenopathy:     Cervical: No cervical adenopathy.  Neurological:     Mental Status: She is alert.     No results found for any visits on 09/11/24.      Assessment & Plan:   Problem List Items Addressed This Visit   None Visit Diagnoses       Acute bronchitis, unspecified organism    -  Primary   Relevant Medications   methylPREDNISolone  (MEDROL  DOSEPAK) 4 MG TBPK tablet   albuterol  (VENTOLIN  HFA) 108 (90 Base) MCG/ACT inhaler   azithromycin  (ZITHROMAX ) 250 MG tablet     Impacted cerumen of right ear         Assessment and Plan    Acute bronchitis Likely secondary to an allergic reaction or  viral infection, with symptoms persisting for over a week, including cough, wheezing, and chest tightness. Differential diagnosis includes viral upper respiratory infection and allergic rhinitis. Discussed potential for secondary bacterial infection due to prolonged symptoms and inflammation.  - Prescribed Medrol  Dosepak to reduce inflammation and open airways. - Prescribed azithromycin  for empiric antibiotic therapy. - Prescribed inhaler for use as needed for severe coughing fits, especially at night. - Advised on potential side effects of inhaler, including jitteriness and increased heart rate. - Instructed to complete all medications before considering further diagnostic steps like chest x-ray if symptoms persist.  Impacted cerumen of right  ear Impacted cerumen in the right ear, previously noted during annual exam. Attempted removal during visit was successful, clearing the ear canal and allowing visualization of the eardrum. - Recommended Debrox ear drops to prevent future cerumen buildup. - Advised on use of Debrox drops three times a week before bed.        Meds ordered this encounter  Medications   methylPREDNISolone  (MEDROL  DOSEPAK) 4 MG TBPK tablet    Sig: Take package as directed.    Dispense:  21 each    Refill:  0   albuterol  (VENTOLIN  HFA) 108 (90 Base) MCG/ACT inhaler    Sig: Inhale 2 puffs into the lungs every 6 (six) hours as needed for wheezing or shortness of breath.    Dispense:  8 g    Refill:  0   azithromycin  (ZITHROMAX ) 250 MG tablet    Sig: Take 2 tablets on day 1, then 1 tablet daily on days 2 through 5    Dispense:  6 tablet    Refill:  0    No follow-ups on file.  Heron CHRISTELLA Sharper, MD   "
